# Patient Record
Sex: Male | Born: 1965 | Race: White | Hispanic: No | Smoking: Former smoker
Health system: Southern US, Community
[De-identification: ages and names within clinical notes are randomized; demographics above are authoritative.]

## PROBLEM LIST (undated history)

## (undated) DIAGNOSIS — S82201A Unspecified fracture of shaft of right tibia, initial encounter for closed fracture: Secondary | ICD-10-CM

## (undated) DIAGNOSIS — M1990 Unspecified osteoarthritis, unspecified site: Secondary | ICD-10-CM

## (undated) DIAGNOSIS — I10 Essential (primary) hypertension: Secondary | ICD-10-CM

## (undated) DIAGNOSIS — K529 Noninfective gastroenteritis and colitis, unspecified: Secondary | ICD-10-CM

## (undated) DIAGNOSIS — K219 Gastro-esophageal reflux disease without esophagitis: Secondary | ICD-10-CM

## (undated) HISTORY — PX: WRIST GANGLION EXCISION: SUR520

## (undated) HISTORY — DX: Gastro-esophageal reflux disease without esophagitis: K21.9

## (undated) HISTORY — PX: KNEE ARTHROSCOPY: SUR90

## (undated) HISTORY — DX: Essential (primary) hypertension: I10

## (undated) HISTORY — PX: HAND SURGERY: SHX662

## (undated) HISTORY — DX: Noninfective gastroenteritis and colitis, unspecified: K52.9

---

## 1998-05-26 ENCOUNTER — Ambulatory Visit (HOSPITAL_COMMUNITY): Admission: RE | Admit: 1998-05-26 | Discharge: 1998-05-26 | Payer: Self-pay | Admitting: Gynecology

## 2003-09-05 ENCOUNTER — Ambulatory Visit (HOSPITAL_COMMUNITY): Admission: RE | Admit: 2003-09-05 | Discharge: 2003-09-05 | Payer: Self-pay | Admitting: Orthopedic Surgery

## 2004-12-16 ENCOUNTER — Ambulatory Visit (HOSPITAL_BASED_OUTPATIENT_CLINIC_OR_DEPARTMENT_OTHER): Admission: RE | Admit: 2004-12-16 | Discharge: 2004-12-16 | Payer: Self-pay | Admitting: Specialist

## 2007-03-27 ENCOUNTER — Encounter (INDEPENDENT_AMBULATORY_CARE_PROVIDER_SITE_OTHER): Payer: Self-pay | Admitting: *Deleted

## 2007-03-27 ENCOUNTER — Ambulatory Visit (HOSPITAL_COMMUNITY): Admission: RE | Admit: 2007-03-27 | Discharge: 2007-03-27 | Payer: Self-pay | Admitting: *Deleted

## 2007-12-18 ENCOUNTER — Encounter (INDEPENDENT_AMBULATORY_CARE_PROVIDER_SITE_OTHER): Payer: Self-pay | Admitting: *Deleted

## 2007-12-18 ENCOUNTER — Ambulatory Visit (HOSPITAL_COMMUNITY): Admission: RE | Admit: 2007-12-18 | Discharge: 2007-12-18 | Payer: Self-pay | Admitting: *Deleted

## 2009-10-03 ENCOUNTER — Encounter (INDEPENDENT_AMBULATORY_CARE_PROVIDER_SITE_OTHER): Payer: Self-pay | Admitting: *Deleted

## 2009-11-03 ENCOUNTER — Ambulatory Visit: Payer: Self-pay | Admitting: Gastroenterology

## 2009-11-03 DIAGNOSIS — K5190 Ulcerative colitis, unspecified, without complications: Secondary | ICD-10-CM | POA: Insufficient documentation

## 2009-11-03 DIAGNOSIS — I10 Essential (primary) hypertension: Secondary | ICD-10-CM

## 2009-11-03 LAB — CONVERTED CEMR LAB
ALT: 23 units/L (ref 0–53)
Eosinophils Absolute: 0.1 10*3/uL (ref 0.0–0.7)
Eosinophils Relative: 2.4 % (ref 0.0–5.0)
HCT: 38.8 % — ABNORMAL LOW (ref 39.0–52.0)
Lymphs Abs: 1.4 10*3/uL (ref 0.7–4.0)
MCHC: 33.6 g/dL (ref 30.0–36.0)
MCV: 93.7 fL (ref 78.0–100.0)
Monocytes Absolute: 0.7 10*3/uL (ref 0.1–1.0)
Platelets: 318 10*3/uL (ref 150.0–400.0)
RDW: 12.6 % (ref 11.5–14.6)
Total Bilirubin: 0.3 mg/dL (ref 0.3–1.2)
WBC: 6 10*3/uL (ref 4.5–10.5)

## 2009-11-04 ENCOUNTER — Encounter: Payer: Self-pay | Admitting: Gastroenterology

## 2009-11-26 ENCOUNTER — Telehealth: Payer: Self-pay | Admitting: Gastroenterology

## 2009-11-27 ENCOUNTER — Ambulatory Visit: Payer: Self-pay | Admitting: Gastroenterology

## 2009-11-27 ENCOUNTER — Encounter (INDEPENDENT_AMBULATORY_CARE_PROVIDER_SITE_OTHER): Payer: Self-pay | Admitting: *Deleted

## 2009-11-28 ENCOUNTER — Encounter: Payer: Self-pay | Admitting: Gastroenterology

## 2009-12-08 ENCOUNTER — Telehealth: Payer: Self-pay | Admitting: Gastroenterology

## 2009-12-08 ENCOUNTER — Encounter: Payer: Self-pay | Admitting: Gastroenterology

## 2009-12-08 ENCOUNTER — Ambulatory Visit: Payer: Self-pay | Admitting: Gastroenterology

## 2009-12-08 ENCOUNTER — Ambulatory Visit (HOSPITAL_COMMUNITY): Admission: RE | Admit: 2009-12-08 | Discharge: 2009-12-08 | Payer: Self-pay | Admitting: Gastroenterology

## 2009-12-09 ENCOUNTER — Telehealth: Payer: Self-pay | Admitting: Gastroenterology

## 2009-12-09 ENCOUNTER — Encounter: Payer: Self-pay | Admitting: Family Medicine

## 2009-12-10 ENCOUNTER — Encounter: Payer: Self-pay | Admitting: Gastroenterology

## 2009-12-24 ENCOUNTER — Telehealth: Payer: Self-pay | Admitting: Gastroenterology

## 2009-12-24 ENCOUNTER — Encounter (INDEPENDENT_AMBULATORY_CARE_PROVIDER_SITE_OTHER): Payer: Self-pay | Admitting: *Deleted

## 2009-12-30 ENCOUNTER — Ambulatory Visit: Payer: Self-pay | Admitting: Gastroenterology

## 2010-01-20 ENCOUNTER — Telehealth: Payer: Self-pay | Admitting: Gastroenterology

## 2010-01-21 ENCOUNTER — Telehealth: Payer: Self-pay | Admitting: Gastroenterology

## 2010-01-28 ENCOUNTER — Telehealth: Payer: Self-pay | Admitting: Gastroenterology

## 2010-02-03 ENCOUNTER — Encounter (INDEPENDENT_AMBULATORY_CARE_PROVIDER_SITE_OTHER): Payer: Self-pay | Admitting: *Deleted

## 2010-07-13 ENCOUNTER — Telehealth: Payer: Self-pay | Admitting: Gastroenterology

## 2010-07-21 ENCOUNTER — Ambulatory Visit: Payer: Self-pay | Admitting: Gastroenterology

## 2010-09-17 NOTE — Progress Notes (Signed)
Summary: med ?'s  Phone Note Call from Patient Call back at [phone]   Caller: Patient Call For: Dr. Deatra Ina Reason for Call: Talk to Nurse Summary of Call: questions about meds Initial call taken by: Lucien Mons,  Dec 24, 2009 10:19 AM  Follow-up for Phone Call        He is taking Apriso 0.353m #4 daily, it is very expensive. He was supposed to start Asacol HD, but too expensive,also.  He wants to know if he can get some samples of Apriso?  Samples given to pt.along with a patient saving card for both meds. Pt. to keep scheduled office visit. Pt. instructed to call back as needed.   Follow-up by: DVivia EwingLPN,  May 11, 28938110:17AM

## 2010-09-17 NOTE — Letter (Signed)
Summary: Appt Reminder [facility]  [address] [place], [place] [address]   Phone: [phone]  Fax: [phone]        Dec 24, 2009 MRN: [id number]    [facility] [place], [place]  [address]    Dear [name],   You have a return appointment with Dr.Robert Deatra Ina on 12-30-09 at 2:45pm. Please remember to bring a complete list of the medicines you are taking, your insurance card and your co-pay.  If you have to cancel or reschedule this appointment, please call before 5:00 pm the evening before to avoid a cancellation fee.  If you have any questions or concerns, please call [phone].    Sincerely,    Vivia Ewing LPN

## 2010-09-17 NOTE — Assessment & Plan Note (Signed)
Summary: F/U APPT...LSW.   History of Present Illness Visit Type: Follow-up Visit Primary GI MD: Erskine Emery MD [facility] Chief Complaint: Pt c/o RUQ cramping for about one week.  He has occ belching.  He is taking Apriso and is doing well. History of Present Illness:   [name] has returned for followup of his left-sided colitis.  He was treated for Pseudomonas colitis with Flagyl.  When diarrhea recurred following treatment he underwent sigmoidoscopy which demonstrated a mild colitis.  On apriso symptoms have subsided although he is unable to afford this medicine.  Currently he has occasional loose stools and some crampy upper abdominal pain.  When his colitic symptoms are active he has both bleeding and diarrhea.   GI Review of Systems    Reports abdominal pain and  belching.     Location of  Abdominal pain: RUQ.    Denies acid reflux, bloating, chest pain, dysphagia with liquids, dysphagia with solids, heartburn, loss of appetite, nausea, vomiting, vomiting blood, weight loss, and  weight gain.        Denies anal fissure, black tarry stools, change in bowel habit, constipation, diarrhea, diverticulosis, fecal incontinence, heme positive stool, hemorrhoids, irritable bowel syndrome, jaundice, light color stool, liver problems, rectal bleeding, and  rectal pain.    Current Medications (verified): 1)  Allopurinol 300 Mg Tabs (Allopurinol) .Marland Kitchen.. 1 Tablet By Mouth Once Daily 2)  Omeprazole 20 Mg Cpdr (Omeprazole) .Marland Kitchen.. 1 Capsule By Mouth Once Daily 3)  Amlodipine Besylate 5 Mg Tabs (Amlodipine Besylate) .Marland Kitchen.. 1 Tablet By Mouth Once Daily 4)  Losartan Potassium 100 Mg Tabs (Losartan Potassium) .Marland Kitchen.. 1 Tablet By Mouth Once Daily 5)  Apriso 0.375 Gm Xr24h-Cap (Mesalamine) .... Take 4 Tablets By Mouth Once Daily  Allergies (verified): No Known Drug Allergies  Past History:  Past Medical History: Hypertension Coliits Gout  Past Surgical History: Reviewed history from 11/03/2009 and no  changes required. Unremarkable  Family History: Reviewed history from 11/03/2009 and no changes required. Patient adopted   Social History: Occupation:  Librarian, academic Married 2 boys 1 girl Patient is a former smoker.  Alcohol Use - yes  1 1/2 case/week Illicit Drug Use - no Daily Caffeine Use 1 cup coffee  Review of Systems  The patient denies allergy/sinus, anemia, anxiety-new, arthritis/joint pain, back pain, blood in urine, breast changes/lumps, confusion, cough, coughing up blood, depression-new, fainting, fatigue, fever, headaches-new, hearing problems, heart murmur, heart rhythm changes, itching, muscle pains/cramps, night sweats, nosebleeds, shortness of breath, skin rash, sleeping problems, sore throat, swelling of feet/legs, swollen lymph glands, thirst - excessive, urination - excessive, urination changes/pain, urine leakage, vision changes, and voice change.    Vital Signs:  Patient profile:   45 year old male Height:      70.5 inches Weight:      239 pounds BMI:     33.93 Pulse rate:   80 / minute Pulse rhythm:   regular BP sitting:   114 / 80  (left arm) Cuff size:   regular  Vitals Entered By: Abelino Derrick CMA Deborra Medina) (Dec 30, 2009 2:51 PM)   Impression & Recommendations:  Problem # 1:  UNSPECIFIED ULCERATIVE COLITIS (ICD-556.9) Assessment Improved At the patient's request I will start him on Azulfidine 1 g twice a day.  I carefully instructed him to call me if his symptoms are not well controlled at which point I would try him on a mesalamine alternative.  Of note he has had pseudomembranous colitis which has resolved as determined by  stool studies.  Patient Instructions: 1)  Copy sent to : Walter Pharr,MD 2)  Please schedule a follow up appointment in 3 months 3)  The medication list was reviewed and reconciled.  All changed / newly prescribed medications were explained.  A complete medication list was provided to the patient /  caregiver. Prescriptions: AZULFIDINE 500 MG TABS (SULFASALAZINE) take 2 tabs twice a day  #60 x 5   Entered and Authorized by:   Inda Castle MD   Signed by:   Inda Castle MD on 12/30/2009   Method used:   Electronically to        [facility] (retail)       [address]       [place], [place]  [address]       Ph: [phone]       Fax: [phone]   RxID:   [id number]

## 2010-09-17 NOTE — Letter (Signed)
Summary: Office Visit Letter  [facility]  [address] [place], [place] [address]   Phone: [phone]  Fax: [phone]      February 03, 2010 MRN: [id number]   [facility] [place], [place]  [address]   Dear Mr. [facility],   According to our records, it is time for you to schedule a follow-up office visit with [place] in the month of August 2011.   At your convenience, please call [phone] (option #2)to schedule an office visit. If you have any questions, concerns, or feel that this letter is in error, we would appreciate your call.   Sincerely,   Sandy Salaam. Deatra Ina, M.D.  [facility] Gastroenterology Division [phone]

## 2010-09-17 NOTE — Progress Notes (Signed)
Summary: Medication  Phone Note From Pharmacy   Caller: [place] Call For: Dr. Deatra Ina  Summary of Call: Asacol 400 is no longer avail......Marland Kitchenneeds a alternative Initial call taken by: Webb Laws,  December 08, 2009 11:11 AM  Follow-up for Phone Call        Dr Leodis Sias Advise Follow-up by: Genella Mech CMA Deborra Medina),  December 08, 2009 1:26 PM  Additional Follow-up for Phone Call Additional follow up Details #1::        Asacol 843m tabs take 1 three times a day #90 with 2 refills Additional Follow-up by: RInda CastleMD,  December 08, 2009 2:40 PM    Additional Follow-up for Phone Call Additional follow up Details #2::    Called pt , pt stated that phone note was taken down wrong, Pt stated that she needs a 90 day supply of the Asacol 400 written and mailed to her for a 90 day supply and mailed to her for her mail order. Follow-up by: RGenella MechCMA (Deborra Medina,  December 08, 2009 3:19 PM  Prescriptions: ASACOL 400 MG TBEC (MESALAMINE) take 3 tabs tid  #810 x 3   Entered by:   RGenella MechCMA (AS[place]   Authorized by:   RInda CastleMD   Signed by:   RGenella MechCMA ([place] on 12/08/2009   Method used:   Print then Give to Patient   RxID:   [id number]

## 2010-09-17 NOTE — Op Note (Signed)
Summary: colonoscopy   Dr Lajoyce Corners  NAME:  [name], [name]              ACCOUNT NO.:  [id number]      MEDICAL RECORD NO.:  [id number]          PATIENT TYPE:  AMB      LOCATION:  ENDO                         [facility]ACILITY:  [facility]      PHYSICIAN:  Waverly Ferrari, M.D.    DATE OF BIRTH:  05-22-1966      DATE OF PROCEDURE:  03/27/2007   DATE OF DISCHARGE:                                  OPERATIVE REPORT      PROCEDURE:  Colonoscopy with biopsy.      INDICATIONS:  Rectal bleeding.      ANESTHESIA:   1. Fentanyl 90 mcg.   2. Versed 6 mg.   3. Phenergan 12.5 mg.      PROCEDURE:  With the patient mildly sedated in the left lateral   decubitus position, the Pentax videoscopic colonoscope was inserted into   the rectum and passed under direct vision.  With pressure applied and   the patient rolled to his back, right side, and subsequently back to his   back and left side, we were able to advance the colonoscope to the cecum   as identified by ileocecal valve and appendiceal orifice, both of which   were photographed.  From this point, the colonoscope was slowly   withdrawn, taking circumferential views of colonic mucosa, stopping at   about 35 cm from the anal verge, at which point the normal-appearing   mucosa turned red and showed changes of colitis, which were photographed   and multiple biopsies taken.  At approximately 20-25 cm from the anal   verge, the mucosa turned back to normal.  This too was photographed and   biopsied and placed in a separate container.  The endoscope was placed   in retroflexion to view the anal canal from above.  The endoscope was   straightened and withdrawn.  The patient's vital signs and pulse   oximeter remained stable.  The patient tolerated the procedure well,   without apparent complication.      FINDINGS:  Localized area of colitis from approximately 20-25 cm from   the anal verge to approximately 35 cm from the anal verge.  Internal   hemorrhoids, rare  diverticulum, otherwise an unremarkable exam.      PLAN:  Await biopsy report.  The patient will call me for results and   follow up with me as an outpatient.                  ______________________________   Waverly Ferrari, M.D.            GMO/MEDQ  D:  03/27/2007  T:  03/28/2007  Job:  [id number]

## 2010-09-17 NOTE — Letter (Signed)
Summary: Results Letter  [facility]  [place], [place] [address]   Phone: [phone]  Fax: [phone]        December 10, 2009 MRN: [id number]    [facility] [place], [place]  [address]    Dear [name],   Your biopsies demonstrated inflammatory changes only.    Please follow the recommendations previously discussed.  Should you have any immediate concerns or questions, feel free to contact me at the office.    Sincerely,  Sandy Salaam. Deatra Ina, M.D., [facility]          Sincerely,  Inda Castle MD  This letter has been electronically signed by your physician.  Appended Document: Results Letter Letter mailed to patient.

## 2010-09-17 NOTE — Progress Notes (Signed)
Summary: Medication  Phone Note Call from Patient Call back at Home Phone [phone]   Caller: Patient Call For: Dr. Deatra Ina Reason for Call: Talk to Nurse Summary of Call: pt is needing a 50msupply of Azulfadine #360 tab. only #180 tablets were called in. Initial call taken by: CWebb Laws  January 21, 2010 2:31 PM  Follow-up for Phone Call        called [facility] and corrected with a rep... i have notified the patient.  Follow-up by: LBernita BuffyCMA (Deborra Medina,  January 21, 2010 5:12 PM    Prescriptions: AZULFIDINE 500 MG TABS (SULFASALAZINE) take 2 tabs twice a day  #360 x 3   Entered by:   LBernita BuffyCMA ([place]   Authorized by:   RInda CastleMD   Signed by:   LBernita BuffyCMA (AWest [place] on 01/21/2010   Method used:   Telephoned to ...       [facility] [address] (retail)       [address]      [place]  [address]      Ph: [phone]      Fax: [phone]  RxID:   [id number]

## 2010-09-17 NOTE — Progress Notes (Signed)
Summary: Change in Asacol  ---- Converted from flag ---- ---- 12/09/2009 9:19 AM, Inda Castle MD wrote: ok  ---- 12/08/2009 4:42 PM, Bernita Buffy CMA (AAMA) wrote: The pharmacy is calling says that they do not make the "regular" Asacol anymore all they make now is Asacol EC 468m and she would like to know if the dose can stay the same on that rx? [facility] an extended release tablet also coated.  RSuanne [phone]------------------------------  Phone Note Outgoing Call   Call placed by: LBernita BuffyCMA (Deborra Medina,  December 09, 2009 1:57 PM Call placed to: Patient Summary of Call: called the pharm ancd advised per Dr. KDeatra Inaok to give Asacol EC at the same dose. Initial call taken by: LBernita BuffyCMA (AAMA),  December 09, 2009 1:57 PM    New/Updated Medications: * ASACOL EC 400 MG TBEC (MESALAMINE) take 3 tabs tid

## 2010-09-17 NOTE — Assessment & Plan Note (Signed)
Summary: rectal bleeding,reflux/sheri   History of Present Illness Visit Type: Follow-up Visit Primary GI MD: Erskine Emery MD [facility] Primary Provider: Junius Roads, MD Chief Complaint: BRB per rectum, no hemorrhoids that pt is aware of.  Pt is also having problems with reflux-belching on Omeprazole History of Present Illness:   [name] has returned for evaluation of rectal bleeding.  He recently reduced his Azulfidine to 1 g daily.  After this reduction he developed loose stools with bleeding.  Over the past week he has increased back to his maintenance dose of 1 g twice a day.  With the increase stools have become more firm and bleeding has decreased.  He is without abdominal pain.   GI Review of Systems    Reports acid reflux.      Denies abdominal pain, belching, bloating, chest pain, dysphagia with liquids, dysphagia with solids, heartburn, loss of appetite, nausea, vomiting, vomiting blood, weight loss, and  weight gain.      Reports rectal bleeding.     Denies anal fissure, black tarry stools, change in bowel habit, constipation, diarrhea, diverticulosis, fecal incontinence, heme positive stool, hemorrhoids, irritable bowel syndrome, jaundice, light color stool, liver problems, and  rectal pain.    Current Medications (verified): 1)  Allopurinol 300 Mg Tabs (Allopurinol) .Marland Kitchen.. 1 Tablet By Mouth Once Daily 2)  Omeprazole 20 Mg Cpdr (Omeprazole) .Marland Kitchen.. 1 Capsule By Mouth Once Daily 3)  Amlodipine Besylate 5 Mg Tabs (Amlodipine Besylate) .Marland Kitchen.. 1 Tablet By Mouth Once Daily 4)  Losartan Potassium 100 Mg Tabs (Losartan Potassium) .Marland Kitchen.. 1 Tablet By Mouth Once Daily 5)  Azulfidine 500 Mg Tabs (Sulfasalazine) .... Take 2 Tabs Twice A Day  Allergies (verified): No Known Drug Allergies  Past History:  Past Medical History: Hypertension Coliits Gout GERD  Past Surgical History: Reviewed history from 11/03/2009 and no changes required. Unremarkable  Family History: Reviewed history  from 11/03/2009 and no changes required. Patient adopted   Social History: Reviewed history from 12/30/2009 and no changes required. 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  Alcohol Use - yes  1 1/2 case/week Illicit Drug Use - no Daily Caffeine Use 1 cup coffee  Vital Signs:  Patient profile:   45 year old male Height:      70.5 inches Weight:      235 pounds BMI:     33.36 Pulse rate:   76 / minute Pulse rhythm:   regular BP sitting:   124 / 82  (left arm) Cuff size:   regular  Vitals Entered By: Abelino Derrick CMA Deborra Medina) (July 21, 2010 2:48 PM)   Impression & Recommendations:  Problem # 1:  UNSPECIFIED ULCERATIVE COLITIS (ICD-556.9) Mild flareup is likely due to a reduction in his maintenance dose.  He appears to be improving.  Recommendations #1 increase Azulfidine 1500 mg twice a day for the next 2-3 weeks.  Provided he is back to baseline  he will reduce to his maintenance dose of 1 g twice a day.   I carefully instructed the patient to call me in 2 weeks if he has not continued to improve at which point I would add a second medication  Patient Instructions: 1)  Copy sent to : Junius Roads, MD 2)  Please schedule a follow-up appointment as needed.  3)  The medication list was reviewed and reconciled.  All changed / newly prescribed medications were explained.  A complete medication list was provided to the patient /  caregiver.   Immunizations Administered:  Influenza Vaccine # 1:    Vaccine Type: Fluvirin    Site: right deltoid    Mfr: novartis    Dose: 0.5 ml    Route: IM    Given by: Abelino Derrick CMA ([place])    Exp. Date: 01/2011    Lot #: [id number]    VIS given: 03/10/10 version given July 21, 2010.  Flu Vaccine Consent Questions:    Do you have a history of severe allergic reactions to this vaccine? no    Any prior history of allergic reactions to egg and/or gelatin? no    Do you have a sensitivity to the  preservative Thimersol? no    Do you have a past history of Guillan-Barre Syndrome? no    Do you currently have an acute febrile illness? no    Have you ever had a severe reaction to latex? no    Vaccine information given and explained to patient? yes

## 2010-09-17 NOTE — Miscellaneous (Signed)
Summary: [facility] Previsit/prep  Clinical Lists Changes  Observations: Added new observation of NKA: T (11/27/2009 16:12)

## 2010-09-17 NOTE — Procedures (Signed)
Summary: Flexible Sigmoidoscopy  Patient: [name] Note: All result statuses are Final unless otherwise noted.  Tests: (1) Flexible Sigmoidoscopy (FLX)  FLX Flexible Sigmoidoscopy                             DONE     [facility]     [place], [place]  [address]           FLEXIBLE SIGMOIDOSCOPY PROCEDURE REPORT           PATIENT:  [name], [name]  MR#:  [id number]     BIRTHDATE:  1966/02/05, 82 yrs. old  GENDER:  male           ENDOSCOPIST:  Sandy Salaam. Deatra Ina, MD     Referred by:           PROCEDURE DATE:  12/08/2009     PROCEDURE:  Flexible Sigmoidoscopy with biopsy     ASA CLASS:  Class II     INDICATIONS:  diarrhea, assess known ulcerative colitis           MEDICATIONS:   Fentanyl 100 mcg IV, Versed 9 mg IV           DESCRIPTION OF PROCEDURE:   After the risks benefits and     alternatives of the procedure were thoroughly explained, informed     consent was obtained.  Digital rectal exam was performed and     revealed no abnormalities.   The  endoscope was introduced through     the anus and advanced to the descending colon, without     limitations.  The quality of the prep was .  The instrument was     then slowly withdrawn as the mucosa was fully examined.     <<PROCEDUREIMAGES>>           Colitis was found rectum to descending colon Multiple areas of     blotchy erythema, mild, throughout left colon. Bxs taken (see     image1, image2, image3, and image5).  Retroflexed views in the     rectum revealed no abnormalities.    The scope was then withdrawn     from the patient and the procedure terminated.           COMPLICATIONS:  None           ENDOSCOPIC IMPRESSION:     1) Colitis in the rectum to descending colon, minimal     RECOMMENDATIONS:     1) continue current meds           REPEAT EXAM:  No           ______________________________     Sandy Salaam. Deatra Ina, MD           CC:  Alden Server, MD           n.     Lorrin Mais:    Sandy Salaam. Kaplan at 12/08/2009 10:05 AM           Huey Bienenstock, [id number]  Note: An exclamation mark (!) indicates a result that was not dispersed into the flowsheet. Document Creation Date: 12/08/2009 10:06 AM _______________________________________________________________________  (1) Order result status: Final Collection or observation date-time: 12/08/2009 10:01 Requested date-time:  Receipt date-time:  Reported date-time:  Referring Physician:   Ordering Physician: Erskine Emery [id number]) Specimen Source:  Source: Tawanna Cooler Order Number: [id number] Lab site:

## 2010-09-17 NOTE — Miscellaneous (Signed)
Summary: Vaccine Orvan Falconer MD  Vaccine Orvan Falconer MD   Imported By: Edmonia James 12/09/2009 11:43:29  _____________________________________________________________________  External Attachment:    Type:   Image     Comment:   External Document

## 2010-09-17 NOTE — Progress Notes (Signed)
Summary: triage  Phone Note Other Incoming Call back at Home Phone [phone]   Caller: Pt's wife, Suanne Marker Details for Reason: triage Summary of Call: Pt's wife Suanne Marker called stating patient was having "bleeding" again and needed to be seen.  Please call and advise. Initial call taken by: Darliss Ridgel,  July 13, 2010 9:39 AM  Follow-up for Phone Call        Wife reports patient is out of town.  She is unsure of his symptoms.  He is having some rectal bleeding and  reflux.  REV is scheduled with Dr Deatra Ina for 07/21/10 2:30.  Patient with a hx of UC. Barb Merino, RN  Follow-up by: Shella Maxim RN,  July 13, 2010 10:02 AM

## 2010-09-17 NOTE — Letter (Signed)
Summary: Results Letter  [facility]  [place], [place] [address]   Phone: [phone]  Fax: [phone]        November 03, 2009 MRN: [id number]    [facility] [place],   [address]    Dear Mr. [facility],  It is my pleasure to have treated you recently as a new patient in my office. I appreciate your confidence and the opportunity to participate in your care.  Since I do have a busy inpatient endoscopy schedule and office schedule, my office hours vary weekly. I am, however, available for emergency calls everyday through my office. If I am not available for an urgent office appointment, another one of our gastroenterologist will be able to assist you.  My well-trained staff are prepared to help you at all times. For emergencies after office hours, a physician from our Gastroenterology section is always available through my 24 hour answering service  Once again I welcome you as a new patient and I look forward to a happy and healthy relationship             Sincerely,  Inda Castle MD  This letter has been electronically signed by your physician.  Appended Document: Results Letter letter mailed

## 2010-09-17 NOTE — Progress Notes (Signed)
Summary: CONDITION UPDATE-Flex scheduled  Phone Note Call from Patient Call back at [phone]   Caller: Patient Call For: Dr. Deatra Ina Reason for Call: Talk to Nurse Summary of Call: would like to discuss last appt and what he needs to do next Initial call taken by: Lucien Mons,  November 26, 2009 11:23 AM  Follow-up for Phone Call        OV 11-03-09, Positive for C-Diff 11-04-09, Completed 10 days of Flagyl, off Prednisone, takes Apriso 3G daily.  Calling with an update: Stools are loosly formed, sees BRB/mucus in toilet and on tissue most mornings. Pt. states he feels fine, no pain.   [facility] PLEASE ADVISE  Follow-up by: Vivia Ewing LPN,  November 27, 8307 2:50 PM  Additional Follow-up for Phone Call Additional follow up Details #1::        needs repeat c. diff test schedule sigmoidoscopy Additional Follow-up by: Inda Castle MD,  November 26, 2009 4:10 PM    Additional Follow-up for Phone Call Additional follow up Details #2::    Above MD orders reviewed with patient. He is scheduled for a previsit on 11-27-09 at 4:30pm and Flex at [facility] on 12-08-09 at 9:45am. He will repeat stool for C-Diff on 11-27-09. Pt. instructed to call back as needed.  Follow-up by: Vivia Ewing LPN,  November 27, 4074 4:56 PM

## 2010-09-17 NOTE — Op Note (Signed)
Summary: colonoscopy  NAME:  [name], AUGELLO              ACCOUNT NO.:  [id number]      MEDICAL RECORD NO.:  [id number]          PATIENT TYPE:  AMB      LOCATION:  ENDO                         FACILITY:  [facility]      PHYSICIAN:  Waverly Ferrari, M.D.    DATE OF BIRTH:  09-25-1965      DATE OF PROCEDURE:   DATE OF DISCHARGE:                                  OPERATIVE REPORT      PROCEDURE:  Colonoscopy.      INDICATIONS:  Rectal bleeding.      ANESTHESIA:  Fentanyl 50 mcg, Versed 3 mg.      PROCEDURE:  With the patient mildly sedated in the left lateral   decubitus position, a rectal exam was attempted.  Subsequently, the   Pentax videoscopic colonoscope was inserted into the rectum and we   immediately saw diffuse colitis and proctitis.  We advanced the   endoscope under direct vision to the cecum, identified by ileocecal   valve and appendiceal orifice, and around the appendiceal orifice, once   again, the colonic mucosa looked somewhat inflamed.  This was   photographed and biopsied.  We attempted to get into the terminal ileum   but could not.  Subsequently, the Pentax videoscopic colonoscope was   slowly withdrawn, taking circumferential views of colonic mucosa,   stopping at approximately 60 cm from anal verge, at which point the   colitis was noted and from this point the colonoscope was slowly   withdrawn taking circumferential views of colonic mucosa, stopping to   photograph and biopsy areas of this diffuse colitis until we reached the   rectum, and biopsies of the rectum and placed in a separate container.   I did not attempt to place the scope in retroflexed view because of   diffuse inflammatory changes.  The endoscope was withdrawn.  The   patient's vital signs and pulse oximeter remained stable.  The patient   tolerated procedure well without apparent complications.      FINDINGS:  Diffuse colitis involving the distal 60 cm of the colon, more   intense distally than  proximally, but continuous from that point and   question of inflammatory changes involving the cecum at the appendiceal   orifice.      PLAN:  Await biopsy reports.  The patient will call me for results and   follow-up with me as an outpatient as needed.                  ______________________________   Waverly Ferrari, M.D.            GMO/MEDQ  D:  12/18/2007  T:  12/18/2007  Job:  [id number]

## 2010-09-17 NOTE — Letter (Signed)
Summary: [facility]  [name], [name] [address]   Phone: [phone]  Fax: [phone]       [name]    1966/06/11    MRN: [id number]        Procedure Day [name]:  [name]  12/08/09     Arrival Time:  8:45AM     Procedure Time:  9:45AM     Location of Procedure:                     [name]  [name] ( Outpatient Registration)    [name]  Prior to the day before your procedure, purchase one 8 oz. bottle of Magnesium Citrate and one Fleet Enema from the laxative section of your drugstore.  _________________________________________________________________________________________________  THE DAY BEFORE YOUR PROCEDURE             DATE: 12/07/09     DAY: SUNDAY  1.   Have a clear liquid dinner the night before your procedure.  2.   Do not drink anything colored red or purple.  Avoid juices with pulp.  No orange juice.              CLEAR LIQUIDS INCLUDE: Water Jello Ice Popsicles Tea (sugar ok, no milk/cream) Powdered fruit flavored drinks Coffee (sugar ok, no milk/cream) Gatorade Juice: apple, white grape, white cranberry  Lemonade Clear bullion, consomm, broth Carbonated beverages (any kind) Strained chicken noodle soup Hard Candy   3.   At 7:00 pm the night before your procedure, drink one bottle of Magnesium Citrate over ice.  4.   Drink at least 3 more glasses of clear liquids before bedtime (preferably juices).  5.   Results are expected usually within 1 to 6 hours after taking the Magnesium Citrate.  ___________________________________________________________________________________________________  THE DAY OF YOUR PROCEDURE            DATE:   12/08/09   DAY: MONDAY  1.   Use Fleet Enema one hour prior to coming for procedure.  2.   You may drink clear liquids until 5:45AM (4 hours before exam)       MEDICATION INSTRUCTIONS  Unless  otherwise instructed, you should take regular prescription medications with a small sip of water as early as possible the morning of your procedure.   Additional medication instructions:  Take your blood pressure medicine the morning of procedure.         OTHER INSTRUCTIONS  You will need a responsible adult at least 46 years of age to accompany you and drive you home.   This person must remain in the waiting room during your procedure.  Wear loose fitting clothing that is easily removed.  Leave jewelry and other valuables at home.  However, you may wish to bring a book to read or an iPod/MP3 player to listen to music as you wait for your procedure to start.  Remove all body piercing jewelry and leave at home.  Total time from sign-in until discharge is approximately 2-3 hours.  You should go home directly after your procedure and rest.  You can resume normal activities the day after your procedure.  The day of your procedure you should not:   Drive   Make legal decisions   Operate machinery   Drink alcohol   Return to work  You will receive specific instructions about eating, activities and medications before you leave.  The above instructions have been reviewed and explained to me by   Emerson Monte RN  November 27, 2009 4:25 PM     I fully understand and can verbalize these instructions _____________________________ Date _________

## 2010-09-17 NOTE — Procedures (Signed)
Summary: Instruction for procedure/[facility] (out pt)  Instruction for procedure/[facility] (out pt)   Imported By: Phillis Knack 12/02/2009 07:41:15  _____________________________________________________________________  External Attachment:    Type:   Image     Comment:   External Document

## 2010-09-17 NOTE — Progress Notes (Signed)
Summary: Needs 34msupply sent to [facility]  Phone Note Call from Patient Call back at Home Phone ([phone]  Call For: Dr KDeatra InaReason for Call: Refill Medication Summary of Call: Needs to have a prescription Azulfadine is working really well and [facility] [place] [facility] Initial call taken by: [facility] January 20, 2010 10:12 AM  Follow-up for Phone Call        rx sent, left message on machine to let pt know rx sent. Follow-up by: LeBernita BuffyMA ([facility]  January 20, 2010 1:29 PM    Prescriptions: AZULFIDINE 500 MG TABS (SULFASALAZINE) take 2 tabs twice a day  #180 x 3   Entered by:   LeBernita BuffyMA ([facility]  Authorized by:   RoInda CastleD   Signed by:   LeBernita BuffyMA (A[facility] 01/20/2010   Method used:   [facility]             ,          Ph: [phone]     Fax: [phone] RxID: :   [id number]

## 2010-09-17 NOTE — Op Note (Signed)
Summary: EGD  Dr Lajoyce Corners  NAME:  [facility], [place]              ACCOUNT NO.:  [id number]      MEDICAL RECORD NO.:  [id number]          PATIENT TYPE:  AMB      LOCATION:  ENDO                         FACILITY:  [facility]      PHYSICIAN:  Waverly Ferrari, M.D.    DATE OF BIRTH:  August 19, 1965      DATE OF PROCEDURE:   DATE OF DISCHARGE:                                  OPERATIVE REPORT      PROCEDURE:  Upper endoscopy.      INDICATIONS:  GERD.      ANESTHESIA:  Fentanyl 75 mcg, Versed 5 mg.      DESCRIPTION OF PROCEDURE:  With the patient mildly sedated in the left   lateral decubitus position, the Pentax videoscopic endoscope was   inserted in the mouth and passed under direct vision through the   esophagus which appeared normal, into the stomach, fundus, body, antrum,   duodenal bulb, second portion duodenum, all appeared normal.  From this   point the endoscope was slowly withdrawn taking circumferential views of   duodenal mucosa until the endoscope had been pulled back into stomach.   Placed in retroflexion to view the stomach from below.  The endoscope   was straightened and withdrawn taking circumferential views of the   remaining gastric and esophageal mucosa.  The patient's vital signs and   pulse oximeter remained stable.  The patient tolerated procedure well   without apparent complications.      FINDINGS:  Negative examination.      PLAN:  Proceed to colonoscopy.                  ______________________________   Waverly Ferrari, M.D.            GMO/MEDQ  D:  12/18/2007  T:  12/18/2007  Job:  [id number]

## 2010-09-17 NOTE — Assessment & Plan Note (Signed)
Summary: colitis--ch.   History of Present Illness Visit Type: Initial Visit Primary GI MD: Erskine Emery MD [facility] Chief Complaint: colitis flare History of Present Illness:   [name] is a 45 year old white male referred at the request of Dr. Shelia Media for evaluation of colitis.  He was diagnosed with ulcerative colitis in 2008.  He has primarily left-sided colitis though biopsies of the more proximal colon also demonstrated inflammatory changes.  He was doing well on apriso until he ran out of the medication.  He was off his medicines for 3 months.  After finding more drug her resumed medication 2 months ago when his symptoms return.  Despite restarting medications he has had moderately severe colitic symptoms consisting of frequent loose bloody stools.  He has about 8 stools daily and awakens him at night.  They are accompanied by urgency.  Stools are loose and mixed with blood.     GI Review of Systems    Reports acid reflux and  belching.      Denies abdominal pain, bloating, chest pain, dysphagia with liquids, dysphagia with solids, heartburn, loss of appetite, nausea, vomiting, vomiting blood, weight loss, and  weight gain.      Reports diarrhea and  rectal bleeding.     Denies anal fissure, black tarry stools, change in bowel habit, constipation, diverticulosis, fecal incontinence, heme positive stool, hemorrhoids, irritable bowel syndrome, jaundice, light color stool, liver problems, and  rectal pain. Preventive Screening-Counseling & Management  Alcohol-Tobacco     Smoking Status: quit      Drug Use:  no.      Current Medications (verified): 1)  Apriso 0.375 Gm Xr24h-Cap (Mesalamine) .... 4 Capsules Once Daily 2)  Allopurinol 300 Mg Tabs (Allopurinol) .Marland Kitchen.. 1 Tablet By Mouth Once Daily 3)  Omeprazole 20 Mg Cpdr (Omeprazole) .Marland Kitchen.. 1 Capsule By Mouth Once Daily 4)  Amlodipine Besylate 5 Mg Tabs (Amlodipine Besylate) .Marland Kitchen.. 1 Tablet By Mouth Once Daily 5)  Losartan Potassium 100 Mg  Tabs (Losartan Potassium) .Marland Kitchen.. 1 Tablet By Mouth Once Daily 6)  Aleve 220 Mg Tabs (Naproxen Sodium) .Marland Kitchen.. 1 Tablet By Mouth Once Daily  Allergies (verified): No Known Drug Allergies  Past History:  Past Medical History: Hypertension Coliits  Past Surgical History: Unremarkable  Family History: Patient adopted   Social History: Reviewed history and no changes required. 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  Alcohol Use - yes  6 per day Illicit Drug Use - no Smoking Status:  quit Drug Use:  no  Review of Systems       The patient complains of change in vision.  The patient denies allergy/sinus, anemia, anxiety-new, arthritis/joint pain, back pain, blood in urine, breast changes/lumps, confusion, cough, coughing up blood, depression-new, fainting, fatigue, fever, headaches-new, hearing problems, heart murmur, heart rhythm changes, itching, muscle pains/cramps, night sweats, nosebleeds, shortness of breath, skin rash, sleeping problems, sore throat, swelling of feet/legs, swollen lymph glands, thirst - excessive, urination - excessive, urination changes/pain, urine leakage, vision changes, and voice change.         All other systems were reviewed and were negative   Vital Signs:  Patient profile:   45 year old male Height:      70.5 inches Weight:      241 pounds BMI:     34.21 Pulse rate:   88 / minute Pulse rhythm:   regular BP sitting:   132 / 98  (left arm)  Vitals Entered By:  Randye Lobo NCMA (November 03, 2009 3:42 PM)  Physical Exam  Additional Exam:  On physical exam he is a well-developed well-nourished male  skin: anicteric HEENT: normocephalic; PEERLA; no nasal or pharyngeal abnormalities neck: supple nodes: no cervical lymphadenopathy chest: clear to ausculatation and percussion heart: no murmurs, gallops, or rubs abd: soft, nontender; BS normoactive; no abdominal masses, tenderness, organomegaly rectal:  deferred ext: no cynanosis, clubbing, edema skeletal: no deformities neuro: oriented x 3; no focal abnormalities    Impression & Recommendations:  Problem # 1:  UNSPECIFIED ULCERATIVE COLITIS (ICD-556.9) Assessment Deteriorated Tthe patient has a pancolitis and is in a flare. Pseudomembranous colitis should be ruled out.  With chronic bleeding anemia is also concern.  Recommendations #1 continue apriso 3 g daily #2 begin prednisone 40 mg a day.  The patient was instructed to call back to report his progress in 4-5 days #3 check CBC Orders: TLB-CBC Platelet - w/Differential (85025-CBCD) TLB-Hepatic/Liver Function Pnl (80076-HEPATIC)  Problem # 2:  ESSENTIAL HYPERTENSION, BENIGN (ICD-401.1) Assessment: Comment Only  Other Orders: T-Culture, C-Diff Toxin A/B ([id number])  Patient Instructions: 1)  We are sending your rx to your mail order pharmacy 2)  You will go to the basement for labs today 3)  Please schedule a follow up appointment in 3 weeks 4)  The medication list was reviewed and reconciled.  All changed / newly prescribed medications were explained.  A complete medication list was provided to the patient / caregiver. 5)  Please schedule a follow-up appointment in 3 weeks.  6)  CC Dr. Deland Pretty Prescriptions: APRISO 0.375 GM XR24H-CAP (MESALAMINE) 4 capsules once daily  #360 x 4   Entered and Authorized by:   Inda Castle MD   Signed by:   Inda Castle MD on 11/03/2009   Method used:   Printed then faxed to ...       [facility], [place] (mail-order)       [place]  [address]       Ph: [phone]       Fax: [phone]   RxID:   [id number] PREDNISONE 10 MG TABS (PREDNISONE) take 4 tablets daily then use as directed  #120 x 3   Entered and Authorized by:   Inda Castle MD   Signed by:   Inda Castle MD on 11/03/2009   Method used:   Printed then faxed to ...       [facility]  [place] (mail-order[place]  [address]       Ph: [phone]       Fax: [phone]   RxID:   [id number]

## 2010-09-17 NOTE — Miscellaneous (Signed)
  Clinical Lists Changes  Medications: Removed medication of APRISO 0.375 GM XR24H-CAP (MESALAMINE) 4 capsules once daily Added new medication of ASACOL 400 MG TBEC (MESALAMINE) take 3 tabs tid - Signed Rx of ASACOL 400 MG TBEC (MESALAMINE) take 3 tabs tid;  #300 x 2;  Signed;  Entered by: Inda Castle MD;  Authorized by: Inda Castle MD;  Method used: Electronically to [facility] [address] [place], [place], [place]  [address], Ph: [phone], Fax: [phone]    Prescriptions: ASACOL 400 MG TBEC (MESALAMINE) take 3 tabs tid  #300 x 2   Entered and Authorized by:   Inda Castle MD   Signed by:   Inda Castle MD on 12/08/2009   Method used:   Electronically to        [facility] (retail)       [address]       [place], [place]  [address]       Ph: [phone]       Fax: [phone]   RxID:   [id number]

## 2010-09-17 NOTE — Letter (Signed)
Summary: New Patient letter  [facility]  [address], [place] [address]   Phone: [phone]  Fax: [phone]       10/03/2009 MRN: [id number]  [place], [place]  [address]  Dear Mr. [facility],  Welcome to the Gastroenterology Division at [facility].    You are scheduled to see Dr.  Deatra Ina on 11-03-08 at 3:45p.m. on the 3rd floor at [facility], [place] [employer].  We ask that you try to arrive at our office 15 minutes prior to your appointment time to allow for check-in.  We would like you to complete the enclosed self-administered evaluation form prior to your visit and bring it with you on the day of your appointment.  We will review it with you.  Also, please bring a complete list of all your medications or, if you prefer, bring the medication bottles and we will list them.  Please bring your insurance card so that we may make a copy of it.  If your insurance requires a referral to see a specialist, please bring your referral form from your primary care physician.  Co-payments are due at the time of your visit and may be paid by cash, check or credit card.     Your office visit will consist of a consult with your physician (includes a physical exam), any laboratory testing he/she may order, scheduling of any necessary diagnostic testing (e.g. x-ray, ultrasound, CT-scan), and scheduling of a procedure (e.g. Endoscopy, Colonoscopy) if required.  Please allow enough time on your schedule to allow for any/all of these possibilities.    If you cannot keep your appointment, please call [phone] to cancel or reschedule prior to your appointment date.  This allows [place] the opportunity to schedule an appointment for another patient in need of care.  If you do not cancel or reschedule by 5 p.m. the business day prior to your appointment date, you will be charged a $50.00 late cancellation/no-show fee.    Thank you for  choosing [facility] for your medical needs.  We appreciate the opportunity to care for you.  Please visit [place] at our website  to learn more about our practice.                     Sincerely,                                                             The Gastroenterology Division

## 2010-09-17 NOTE — Progress Notes (Signed)
Summary: ? re meds  Phone Note From Pharmacy Call back at [phone]   Caller: [facility] ---Kendrick Fries  Summary of Call: Kendrick Fries from [facility] wants to know if is ok for pt to get a 90 days supply for his meds. reference [id number] Initial call taken by: Ronalee Red,  January 28, 2010 10:02 AM  Follow-up for Phone Call        left messgae on [facility] machine ok to fill 3 month supply as they were advised on 01/21/2010.  Follow-up by: Bernita Buffy CMA Deborra Medina),  January 28, 2010 10:59 AM

## 2010-12-29 NOTE — Op Note (Signed)
[name], [name] NO.:  [id number]   MEDICAL RECORD NO.:  [id number]          PATIENT TYPE:  AMB   LOCATION:  ENDO                         FACILITY:  [facility]   PHYSICIAN:  Waverly Ferrari, M.D.    DATE OF BIRTH:  1966-05-25   DATE OF PROCEDURE:  DATE OF DISCHARGE:                               OPERATIVE REPORT   PROCEDURE:  Upper endoscopy.   INDICATIONS:  GERD.   ANESTHESIA:  Fentanyl 75 mcg, Versed 5 mg.   DESCRIPTION OF PROCEDURE:  With the patient mildly sedated in the left  lateral decubitus position, the Pentax videoscopic endoscope was  inserted in the mouth and passed under direct vision through the  esophagus which appeared normal, into the stomach, fundus, body, antrum,  duodenal bulb, second portion duodenum, all appeared normal.  From this  point the endoscope was slowly withdrawn taking circumferential views of  duodenal mucosa until the endoscope had been pulled back into stomach.  Placed in retroflexion to view the stomach from below.  The endoscope  was straightened and withdrawn taking circumferential views of the  remaining gastric and esophageal mucosa.  The patient's vital signs and  pulse oximeter remained stable.  The patient tolerated procedure well  without apparent complications.   FINDINGS:  Negative examination.   PLAN:  Proceed to colonoscopy.           ______________________________  Waverly Ferrari, M.D.     GMO/MEDQ  D:  12/18/2007  T:  12/18/2007  Job:  [id number]

## 2010-12-29 NOTE — Op Note (Signed)
[name], HOCTOR NO.:  [id number]   MEDICAL RECORD NO.:  [id number]          PATIENT TYPE:  AMB   LOCATION:  ENDO                         FACILITY:  [facility]   PHYSICIAN:  Waverly Ferrari, M.D.    DATE OF BIRTH:  1965/12/02   DATE OF PROCEDURE:  03/27/2007  DATE OF DISCHARGE:                               OPERATIVE REPORT   PROCEDURE:  Colonoscopy with biopsy.   INDICATIONS:  Rectal bleeding.   ANESTHESIA:  1. Fentanyl 90 mcg.  2. Versed 6 mg.  3. Phenergan 12.5 mg.   PROCEDURE:  With the patient mildly sedated in the left lateral  decubitus position, the Pentax videoscopic colonoscope was inserted into  the rectum and passed under direct vision.  With pressure applied and  the patient rolled to his back, right side, and subsequently back to his  back and left side, we were able to advance the colonoscope to the cecum  as identified by ileocecal valve and appendiceal orifice, both of which  were photographed.  From this point, the colonoscope was slowly  withdrawn, taking circumferential views of colonic mucosa, stopping at  about 35 cm from the anal verge, at which point the normal-appearing  mucosa turned red and showed changes of colitis, which were photographed  and multiple biopsies taken.  At approximately 20-25 cm from the anal  verge, the mucosa turned back to normal.  This too was photographed and  biopsied and placed in a separate container.  The endoscope was placed  in retroflexion to view the anal canal from above.  The endoscope was  straightened and withdrawn.  The patient's vital signs and pulse  oximeter remained stable.  The patient tolerated the procedure well,  without apparent complication.   FINDINGS:  Localized area of colitis from approximately 20-25 cm from  the anal verge to approximately 35 cm from the anal verge.  Internal  hemorrhoids, rare diverticulum, otherwise an unremarkable exam.   PLAN:  Await biopsy report.  The  patient will call me for results and  follow up with me as an outpatient.           ______________________________  Waverly Ferrari, M.D.     GMO/MEDQ  D:  03/27/2007  T:  03/28/2007  Job:  [id number]

## 2010-12-29 NOTE — Op Note (Signed)
[name], [name] NO.:  [id number]   MEDICAL RECORD NO.:  [id number]          PATIENT TYPE:  AMB   LOCATION:  ENDO                         FACILITY:  [facility]   PHYSICIAN:  Waverly Ferrari, M.D.    DATE OF BIRTH:  August 25, 1965   DATE OF PROCEDURE:  DATE OF DISCHARGE:                               OPERATIVE REPORT   PROCEDURE:  Colonoscopy.   INDICATIONS:  Rectal bleeding.   ANESTHESIA:  Fentanyl 50 mcg, Versed 3 mg.   PROCEDURE:  With the patient mildly sedated in the left lateral  decubitus position, a rectal exam was attempted.  Subsequently, the  Pentax videoscopic colonoscope was inserted into the rectum and we  immediately saw diffuse colitis and proctitis.  We advanced the  endoscope under direct vision to the cecum, identified by ileocecal  valve and appendiceal orifice, and around the appendiceal orifice, once  again, the colonic mucosa looked somewhat inflamed.  This was  photographed and biopsied.  We attempted to get into the terminal ileum  but could not.  Subsequently, the Pentax videoscopic colonoscope was  slowly withdrawn, taking circumferential views of colonic mucosa,  stopping at approximately 60 cm from anal verge, at which point the  colitis was noted and from this point the colonoscope was slowly  withdrawn taking circumferential views of colonic mucosa, stopping to  photograph and biopsy areas of this diffuse colitis until we reached the  rectum, and biopsies of the rectum and placed in a separate container.  I did not attempt to place the scope in retroflexed view because of  diffuse inflammatory changes.  The endoscope was withdrawn.  The  patient's vital signs and pulse oximeter remained stable.  The patient  tolerated procedure well without apparent complications.   FINDINGS:  Diffuse colitis involving the distal 60 cm of the colon, more  intense distally than proximally, but continuous from that point and  question of inflammatory  changes involving the cecum at the appendiceal  orifice.   PLAN:  Await biopsy reports.  The patient will call me for results and  follow-up with me as an outpatient as needed.           ______________________________  Waverly Ferrari, M.D.     GMO/MEDQ  D:  12/18/2007  T:  12/18/2007  Job:  [id number]

## 2011-01-01 NOTE — Op Note (Signed)
NAMECLEMENS, [name] NO.:  [id number]   MEDICAL RECORD NO.:  [id number]          PATIENT TYPE:  AMB   LOCATION:  [facility]                         FACILITY:  [facility]   PHYSICIAN:  Susa Day, M.D.    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   POSTOPERATIVE DIAGNOSES:  1.  Medial meniscus tear and gouty arthropathy of the left knee.  2.  Chondromalacia of the patellofemoral joint.   PROCEDURES PERFORMED:  Left knee arthroscopy, debridement, chondroplasty of  the patella and femoral condyle, partial medial meniscectomy.   ANESTHESIA:  General.   No assistant.   BRIEF HISTORY AND INDICATIONS:  A 45 year old with knee pain with giving  away, MRI indicating medial meniscus tear and a history of gout consistent  with gouty arthropathy refractory to conservative treatment.  Operative  intervention is indicated for partial medial meniscectomy and debridement.  The risks and benefits have been discussed, including bleeding, infection,  damage to vascular structures, no change in symptoms, worsening of symptoms,  need for repeat debridement in the future, etc.   TECHNIQUE:  The patient in supine position after the induction of adequate  general anesthesia and 1 g of Kefzol was placed in the supine position.  The  left lower extremity was prepped and draped in the usual sterile fashion.  A  lateral parapatellar portal and a superomedial parapatellar portal was  fashioned with a #11 blade, ingress cannula atraumatically placed, irrigant  was utilized to insufflate the joint.  Under direct visualization a medial  parapatellar portal was fashioned with a #11 blade after localization with  an 18-gauge needle, sparing the medial meniscus.  First examination revealed  a gouty deposit throughout the  joint.  There was a complex tear of the  posterior portion of the medial meniscus.  Chondromalacia of the  patellofemoral joint was noted.  The lateral compartment was unremarkable  and the meniscus stable to probe palpation without evidence of a tear.  The  lateral compartment, though, had gouty deposits noted.  I introduced a  shaver and utilized it to perform a chondroplasty of the patella and  performed a partial medial meniscectomy to a stable base by straight and  upbiting pituitary and a 4.2 cuda shaver to a stable base.  Approximately  50% of the posterior third of the meniscus had to be resected.  This was in  the inner ring.  The remnant then was stable to probe palpation following  this.  A combination of debridement with the shaver and with a neural hook  and displacing the gouty deposits, I then as best as possible to displace  the gouty deposits and evaluated them with a 4.2 cuda shaver.  Following  this, the gutters were examined, and they were unremarkable.  The meniscal  remnants were unremarkable for a displaced fragment.  The ACL and PCL were  examined and the PCL showed no  evidence of tearing from the medial view.  The ACL showed what appeared to be a slight anterior substance tear, but  under anesthesia he had a negative [name] and no empty wall sign.   Next the knee was copiously lavaged, all instrumentation was removed, and  the portals were closed with 4-0 nylon simple sutures, 0.25% Marcaine with  epinephrine was infiltrated in the joint, and the wound was dressed  sterilely.  He was awoken without difficulty and transported to the recovery  room in satisfactory condition.   The patient tolerated the procedure well with no complication.      JB/MEDQ  D:  12/16/2004  T:  12/16/2004  Job:  [id number]

## 2011-02-19 ENCOUNTER — Other Ambulatory Visit: Payer: Self-pay | Admitting: Gastroenterology

## 2011-04-27 ENCOUNTER — Ambulatory Visit (INDEPENDENT_AMBULATORY_CARE_PROVIDER_SITE_OTHER): Payer: 59 | Admitting: Gastroenterology

## 2011-04-27 ENCOUNTER — Encounter: Payer: Self-pay | Admitting: Gastroenterology

## 2011-04-27 DIAGNOSIS — K219 Gastro-esophageal reflux disease without esophagitis: Secondary | ICD-10-CM

## 2011-04-27 DIAGNOSIS — K5150 Left sided colitis without complications: Secondary | ICD-10-CM | POA: Insufficient documentation

## 2011-04-27 MED ORDER — SULFASALAZINE 500 MG PO TABS: 1500.0000 mg | ORAL_TABLET | Freq: Two times a day (BID) | ORAL | Status: DC

## 2011-04-27 NOTE — Assessment & Plan Note (Addendum)
Recent flareup was likely due to the patient missing several doses of Azulfidine. He is back in clinical remission.

## 2011-04-27 NOTE — Assessment & Plan Note (Signed)
Symptoms well controlled with omeprazole

## 2011-04-27 NOTE — Patient Instructions (Signed)
Follow up as needed

## 2011-04-27 NOTE — Progress Notes (Signed)
History of Present Illness:  Mr. Jessop has returned for evaluation of diarrhea and bleeding. He has a history of left-sided colitis. About a month ago he developed some loose stools and bleeding. This was coincidental with him missing several doses of Azulfidine. In the last 2 weeks symptoms have entirely subsided. He is now having normal bowel movements. Bleeding has entirely resolved. He denies pyrosis but has noted slight increase in excess belching postprandially. He remains on omeprazole daily.    Review of Systems: Pertinent positive and negative review of systems were noted in the above HPI section. All other review of systems were otherwise negative.    Current Medications, Allergies, Past Medical History, Past Surgical History, Family History and Social History were reviewed in [facility] record  Vital signs were reviewed in today's medical record. Physical Exam: General: Well developed , well nourished, no acute distress Head: Normocephalic and atraumatic Eyes:  sclerae anicteric, EOMI Ears: Normal auditory acuity Mouth: No deformity or lesions Lungs: Clear throughout to auscultation Heart: Regular rate and rhythm; no rubs or bruits; there is a 3-7/9 early systolic murmur Abdomen: Soft, non tender and non distended. No masses, hepatosplenomegaly or hernias noted. Normal Bowel sounds Rectal:deferred Musculoskeletal: Symmetrical with no gross deformities  Pulses:  Normal pulses noted Extremities: No clubbing, cyanosis, edema or deformities noted Neurological: Alert oriented x 4, grossly nonfocal Psychological:  Alert and cooperative. Normal mood and affect

## 2012-01-04 ENCOUNTER — Telehealth: Payer: Self-pay | Admitting: Gastroenterology

## 2012-01-04 NOTE — Telephone Encounter (Signed)
Wife reports pt is having rectal bleeding. Requesting pt be seen. Pt scheduled to see Amy Esterwood PA 01/07/12@8 :30am. Pt aware of appt date and time.

## 2012-01-07 ENCOUNTER — Ambulatory Visit (INDEPENDENT_AMBULATORY_CARE_PROVIDER_SITE_OTHER): Payer: BC Managed Care – PPO | Admitting: Physician Assistant

## 2012-01-07 ENCOUNTER — Encounter: Payer: Self-pay | Admitting: Physician Assistant

## 2012-01-07 VITALS — BP 142/90 | HR 58 | Ht 71.0 in | Wt 237.4 lb

## 2012-01-07 DIAGNOSIS — K625 Hemorrhage of anus and rectum: Secondary | ICD-10-CM

## 2012-01-07 DIAGNOSIS — K5150 Left sided colitis without complications: Secondary | ICD-10-CM

## 2012-01-07 MED ORDER — BUDESONIDE 9 MG PO TB24: 1.0000 | ORAL_TABLET | Freq: Every morning | ORAL | Status: DC

## 2012-01-07 NOTE — Progress Notes (Signed)
Subjective:    Patient ID: [name], male    DOB: 1965/09/15, 46 y.o.   MRN: [id number]  HPI Uri is a very nice 46 year old white male known to Dr. Deatra Ina with diagnosis of left-sided ulcerative colitis. He has been maintained on Azulfidine over the past couple of years at 3 g per day. His last procedure was done in April of 2011 with flexible sigmoidoscopy which did show an active minimal left-sided colitis. Prior to that he had had colonoscopy with Dr. Lajoyce Corners about 5 years ago at which time he was diagnosed. Patient relates that he has required a couple of courses of prednisone him and he has never been hospitalized there  He states that he has been taking his medication regularly though does admit that he may miss a dose here or there. Now over the past month he has had increased urgency for bowel movements abdominal cramping which is been mild but persistent. He is also having 5-6 bowel movements per day of loose stool containing bright red blood. His appetite has been fine his weight has been stable he has no complaints of nausea or vomiting. No fever or chills and his energy level has been normal for him. Been on any new medications antibiotics supplements etc.  The patient did have a routine physical done last week at Dr. Pennie Banter office, was told that his stool was Hemoccult positive at that time. He says that all of his labs were "normal".    Review of Systems  Constitutional: Negative.   HENT: Negative.   Eyes: Negative.   Respiratory: Negative.   Cardiovascular: Negative.   Gastrointestinal: Positive for abdominal pain, diarrhea and blood in stool.  Genitourinary: Negative.   Musculoskeletal: Negative.   Neurological: Negative.   Hematological: Negative.   Psychiatric/Behavioral: Negative.    Outpatient Prescriptions Prior to Visit  Medication Sig Dispense Refill  . allopurinol (ZYLOPRIM) 300 MG tablet Take 300 mg by mouth daily.        Marland Kitchen amLODipine (NORVASC) 5 MG tablet  Take 10 mg by mouth daily.       Marland Kitchen losartan (COZAAR) 100 MG tablet Take 100 mg by mouth daily.        Marland Kitchen omeprazole (PRILOSEC) 20 MG capsule Take 20 mg by mouth daily.        Marland Kitchen sulfaSALAzine (AZULFIDINE) 500 MG tablet Take 3 tablets (1,500 mg total) by mouth 2 (two) times daily.  180 tablet  12   No Known Allergies Patient Active Problem List  Diagnoses  . ESSENTIAL HYPERTENSION, BENIGN  . UNSPECIFIED ULCERATIVE COLITIS  . Left sided ulcerative (chronic) colitis  . Esophageal reflux   History   Social History  . Marital Status: Married    Spouse Name: N/A    Number of Children: 3  . Years of Education: N/A   Occupational History  . SUPERVISOR    Social History Main Topics  . Smoking status: Former Smoker    Quit date: 08/16/2005  . Smokeless tobacco: Never Used  . Alcohol Use: 21.6 oz/week    36 Cans of beer per week  . Drug Use: No  . Sexually Active: Not on file   Other Topics Concern  . Not on file   Social History Narrative  . No narrative on file       Objective:   Physical Exam well-developed white male in no acute distress, pleasant blood pressure 142/90 pulse 58 height 5 foot 11 weight 237. HEENT; nontraumatic normocephalic EOMI PERRLA sclera  anicteric conjunctiva pink, Neck; supple no JVD, Cardiovascular; regular rate and rhythm with S1-S2 no murmur or gallop, Pulmonary; clear bilaterally, Abdomen; soft I is tender in the left mid quadrant left lower quadrant there is no guarding no rebound no palpable mass or hepatosplenomegaly bowel sounds are active, Rectal; exam not done, Extremities; no clubbing cyanosis or edema, Psych mood and affect normal and appropriate.        Assessment & Plan:  #[age] year old male with known left-sided ulcerative colitis, maintained on chronic Azulfidine now presenting with one month history of exacerbation with mild abdominal cramping diarrhea and rectal bleeding. Plan; increase Azulfidine 500 mg; 4 tablets by mouth twice daily    Trial of Uceris 9 mg once daily. Patient was given samples today and also a prescription. Would plan for 6-8 week course. Patient asked to call back in about 2 weeks with a progress report and if at that time he is not clearly improved, will need to consider colonoscopy with Dr. Deatra Ina and prednisone.

## 2012-01-07 NOTE — Patient Instructions (Signed)
We sent prescription for Uceris 9 mg to [facility], [place], [place], samples given also. Increase the Sulfasalazine ( Azulfidine) 500 mg to 4 tablets twice daily.  Call if symptoms worsen at any point. Call in 2 weeks with a progress report.  You may ask for Bethzaida Boord at [phone].

## 2012-01-07 NOTE — Progress Notes (Signed)
I agree with excellent assessment and plan per Amy Esterwood,PA.

## 2012-01-14 ENCOUNTER — Other Ambulatory Visit: Payer: Self-pay | Admitting: Gastroenterology

## 2012-01-14 MED ORDER — SULFASALAZINE 500 MG PO TABS: 2000.0000 mg | ORAL_TABLET | Freq: Two times a day (BID) | ORAL | Status: DC

## 2012-01-14 NOTE — Telephone Encounter (Signed)
Personally called in script dose increase of Sulfasalazine to [facility]   Patient aware

## 2012-03-01 ENCOUNTER — Other Ambulatory Visit: Payer: Self-pay | Admitting: *Deleted

## 2012-05-15 ENCOUNTER — Telehealth: Payer: Self-pay | Admitting: Gastroenterology

## 2012-05-15 MED ORDER — SULFASALAZINE 500 MG PO TABS: 2000.0000 mg | ORAL_TABLET | Freq: Two times a day (BID) | ORAL | Status: DC

## 2012-05-15 NOTE — Telephone Encounter (Signed)
Changed from [facility] to [facility] in [place]. 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

## 2012-07-10 ENCOUNTER — Encounter (HOSPITAL_COMMUNITY): Payer: Self-pay | Admitting: Certified Registered Nurse Anesthetist

## 2012-07-10 ENCOUNTER — Encounter (HOSPITAL_COMMUNITY): Payer: Self-pay | Admitting: *Deleted

## 2012-07-10 ENCOUNTER — Encounter (HOSPITAL_COMMUNITY)
Admission: RE | Disposition: A | Discharge status: Home / Self Care | Payer: Self-pay | Source: Ambulatory Visit | Attending: Orthopedic Surgery

## 2012-07-10 ENCOUNTER — Ambulatory Visit (HOSPITAL_COMMUNITY): Payer: 59

## 2012-07-10 ENCOUNTER — Other Ambulatory Visit (HOSPITAL_COMMUNITY): Payer: Self-pay | Admitting: Orthopedic Surgery

## 2012-07-10 ENCOUNTER — Ambulatory Visit (HOSPITAL_COMMUNITY)
Admission: RE | Admit: 2012-07-10 | Discharge: 2012-07-11 | Disposition: A | Discharge status: Home / Self Care | Payer: 59 | Source: Ambulatory Visit | Attending: Orthopedic Surgery | Admitting: Orthopedic Surgery

## 2012-07-10 ENCOUNTER — Ambulatory Visit (HOSPITAL_COMMUNITY): Payer: 59 | Admitting: Certified Registered Nurse Anesthetist

## 2012-07-10 DIAGNOSIS — Y9341 Activity, dancing: Secondary | ICD-10-CM | POA: Insufficient documentation

## 2012-07-10 DIAGNOSIS — K219 Gastro-esophageal reflux disease without esophagitis: Secondary | ICD-10-CM | POA: Insufficient documentation

## 2012-07-10 DIAGNOSIS — S8263XA Displaced fracture of lateral malleolus of unspecified fibula, initial encounter for closed fracture: Secondary | ICD-10-CM

## 2012-07-10 DIAGNOSIS — I10 Essential (primary) hypertension: Secondary | ICD-10-CM | POA: Insufficient documentation

## 2012-07-10 DIAGNOSIS — X500XXA Overexertion from strenuous movement or load, initial encounter: Secondary | ICD-10-CM | POA: Insufficient documentation

## 2012-07-10 DIAGNOSIS — Z79899 Other long term (current) drug therapy: Secondary | ICD-10-CM | POA: Insufficient documentation

## 2012-07-10 DIAGNOSIS — Y929 Unspecified place or not applicable: Secondary | ICD-10-CM | POA: Insufficient documentation

## 2012-07-10 HISTORY — DX: Unspecified osteoarthritis, unspecified site: M19.90

## 2012-07-10 HISTORY — PX: ORIF ANKLE FRACTURE: SHX5408

## 2012-07-10 LAB — BASIC METABOLIC PANEL
BUN: 12 mg/dL (ref 6–23)
GFR calc Af Amer: >90 mL/min (ref >90)
GFR calc non Af Amer: >90 mL/min (ref >90)
Potassium: 4.6 mEq/L (ref 3.5–5.1)
Sodium: 142 mEq/L (ref 135–145)

## 2012-07-10 LAB — SURGICAL PCR SCREEN
MRSA, PCR: NEGATIVE
Staphylococcus aureus: POSITIVE — AB

## 2012-07-10 LAB — CBC
Hemoglobin: 14.7 g/dL (ref 13.0–17.0)
MCHC: 34.6 g/dL (ref 30.0–36.0)
RDW: 12.3 % (ref 11.5–15.5)
WBC: 7.1 10*3/uL (ref 4.0–10.5)

## 2012-07-10 SURGERY — OPEN REDUCTION INTERNAL FIXATION (ORIF) ANKLE FRACTURE
Anesthesia: General | Site: Ankle | Laterality: Left

## 2012-07-10 MED ORDER — HYDROCODONE-ACETAMINOPHEN 5-325 MG PO TABS: 1.0000 | ORAL_TABLET | ORAL | Status: DC | PRN

## 2012-07-10 MED ORDER — METOCLOPRAMIDE HCL 10 MG PO TABS: 5.0000 mg | ORAL_TABLET | Freq: Three times a day (TID) | ORAL | Status: DC | PRN

## 2012-07-10 MED ORDER — METHOCARBAMOL 100 MG/ML IJ SOLN
500.0000 mg | Freq: Four times a day (QID) | INTRAVENOUS | Status: DC | PRN
  Filled 2012-07-10: qty 5

## 2012-07-10 MED ORDER — ARTIFICIAL TEARS OP OINT
TOPICAL_OINTMENT | OPHTHALMIC | Status: DC | PRN
  Administered 2012-07-10: 1 via OPHTHALMIC

## 2012-07-10 MED ORDER — CEFAZOLIN SODIUM-DEXTROSE 2-3 GM-% IV SOLR
2.0000 g | Freq: Four times a day (QID) | INTRAVENOUS | Status: AC
  Administered 2012-07-10 – 2012-07-11 (×3): 2 g via INTRAVENOUS
  Filled 2012-07-10 (×3): qty 50

## 2012-07-10 MED ORDER — LOSARTAN POTASSIUM 50 MG PO TABS
100.0000 mg | ORAL_TABLET | Freq: Every day | ORAL | Status: DC
  Administered 2012-07-11: 100 mg via ORAL
  Filled 2012-07-10: qty 2

## 2012-07-10 MED ORDER — CEFAZOLIN SODIUM-DEXTROSE 2-3 GM-% IV SOLR
INTRAVENOUS | Status: DC | PRN
  Administered 2012-07-10: 2 g via INTRAVENOUS

## 2012-07-10 MED ORDER — SULFASALAZINE 500 MG PO TABS
2000.0000 mg | ORAL_TABLET | Freq: Two times a day (BID) | ORAL | Status: DC
  Administered 2012-07-11 (×2): 2000 mg via ORAL
  Filled 2012-07-10 (×3): qty 4

## 2012-07-10 MED ORDER — MUPIROCIN 2 % EX OINT
TOPICAL_OINTMENT | CUTANEOUS | Status: AC
  Filled 2012-07-10: qty 22

## 2012-07-10 MED ORDER — ONDANSETRON HCL 4 MG PO TABS: 4.0000 mg | ORAL_TABLET | Freq: Four times a day (QID) | ORAL | Status: DC | PRN

## 2012-07-10 MED ORDER — LACTATED RINGERS IV SOLN
INTRAVENOUS | Status: DC | PRN
  Administered 2012-07-10: 19:00:00 via INTRAVENOUS

## 2012-07-10 MED ORDER — ONDANSETRON HCL 4 MG/2ML IJ SOLN: 4.0000 mg | Freq: Four times a day (QID) | INTRAMUSCULAR | Status: DC | PRN

## 2012-07-10 MED ORDER — OXYCODONE-ACETAMINOPHEN 5-325 MG PO TABS
1.0000 | ORAL_TABLET | ORAL | Status: DC | PRN
  Administered 2012-07-11 (×3): 2 via ORAL
  Filled 2012-07-10 (×3): qty 2

## 2012-07-10 MED ORDER — CEFAZOLIN SODIUM-DEXTROSE 2-3 GM-% IV SOLR
INTRAVENOUS | Status: AC
  Filled 2012-07-10: qty 50

## 2012-07-10 MED ORDER — HYDROMORPHONE HCL PF 1 MG/ML IJ SOLN
0.5000 mg | INTRAMUSCULAR | Status: DC | PRN
  Administered 2012-07-10: 1 mg via INTRAVENOUS
  Filled 2012-07-10: qty 1

## 2012-07-10 MED ORDER — PANTOPRAZOLE SODIUM 40 MG PO TBEC
40.0000 mg | DELAYED_RELEASE_TABLET | Freq: Every day | ORAL | Status: DC
  Administered 2012-07-11: 40 mg via ORAL
  Filled 2012-07-10: qty 1

## 2012-07-10 MED ORDER — CEFAZOLIN SODIUM-DEXTROSE 2-3 GM-% IV SOLR: 2.0000 g | INTRAVENOUS | Status: DC

## 2012-07-10 MED ORDER — OXYCODONE HCL 5 MG PO TABS: 5.0000 mg | ORAL_TABLET | Freq: Once | ORAL | Status: DC | PRN

## 2012-07-10 MED ORDER — PROPOFOL 10 MG/ML IV BOLUS
INTRAVENOUS | Status: DC | PRN
  Administered 2012-07-10: 200 mg via INTRAVENOUS

## 2012-07-10 MED ORDER — AMLODIPINE BESYLATE 10 MG PO TABS
10.0000 mg | ORAL_TABLET | Freq: Every day | ORAL | Status: DC
  Administered 2012-07-11: 10 mg via ORAL
  Filled 2012-07-10: qty 1

## 2012-07-10 MED ORDER — OXYCODONE HCL 5 MG/5ML PO SOLN: 5.0000 mg | Freq: Once | ORAL | Status: DC | PRN

## 2012-07-10 MED ORDER — MIDAZOLAM HCL 5 MG/5ML IJ SOLN
INTRAMUSCULAR | Status: DC | PRN
  Administered 2012-07-10: 2 mg via INTRAVENOUS

## 2012-07-10 MED ORDER — ONDANSETRON HCL 4 MG/2ML IJ SOLN
INTRAMUSCULAR | Status: DC | PRN
  Administered 2012-07-10: 4 mg via INTRAVENOUS

## 2012-07-10 MED ORDER — FENTANYL CITRATE 0.05 MG/ML IJ SOLN
INTRAMUSCULAR | Status: DC | PRN
  Administered 2012-07-10: 150 ug via INTRAVENOUS
  Administered 2012-07-10 (×2): 50 ug via INTRAVENOUS

## 2012-07-10 MED ORDER — SODIUM CHLORIDE 0.9 % IV SOLN: INTRAVENOUS | Status: DC

## 2012-07-10 MED ORDER — MIDAZOLAM HCL 2 MG/2ML IJ SOLN: 0.5000 mg | Freq: Once | INTRAMUSCULAR | Status: DC | PRN

## 2012-07-10 MED ORDER — PROMETHAZINE HCL 25 MG/ML IJ SOLN: 6.2500 mg | INTRAMUSCULAR | Status: DC | PRN

## 2012-07-10 MED ORDER — 0.9 % SODIUM CHLORIDE (POUR BTL) OPTIME
TOPICAL | Status: DC | PRN
  Administered 2012-07-10: 300 mL

## 2012-07-10 MED ORDER — HYDROMORPHONE HCL PF 1 MG/ML IJ SOLN
0.2500 mg | INTRAMUSCULAR | Status: DC | PRN
  Administered 2012-07-10: 0.5 mg via INTRAVENOUS

## 2012-07-10 MED ORDER — MEPERIDINE HCL 25 MG/ML IJ SOLN: 6.2500 mg | INTRAMUSCULAR | Status: DC | PRN

## 2012-07-10 MED ORDER — METHOCARBAMOL 500 MG PO TABS: 500.0000 mg | ORAL_TABLET | Freq: Four times a day (QID) | ORAL | Status: DC | PRN

## 2012-07-10 MED ORDER — HYDROMORPHONE HCL PF 1 MG/ML IJ SOLN
INTRAMUSCULAR | Status: DC | PRN
  Administered 2012-07-10: 1 mg via INTRAVENOUS

## 2012-07-10 MED ORDER — ATENOLOL 50 MG PO TABS
50.0000 mg | ORAL_TABLET | Freq: Every day | ORAL | Status: DC
  Administered 2012-07-11: 50 mg via ORAL
  Filled 2012-07-10: qty 1

## 2012-07-10 MED ORDER — ALLOPURINOL 300 MG PO TABS
300.0000 mg | ORAL_TABLET | Freq: Every day | ORAL | Status: DC
  Administered 2012-07-11: 300 mg via ORAL
  Filled 2012-07-10: qty 1

## 2012-07-10 MED ORDER — LIDOCAINE HCL 1 % IJ SOLN
INTRAMUSCULAR | Status: DC | PRN
  Administered 2012-07-10: 20 mg via INTRADERMAL

## 2012-07-10 MED ORDER — METOCLOPRAMIDE HCL 5 MG/ML IJ SOLN: 5.0000 mg | Freq: Three times a day (TID) | INTRAMUSCULAR | Status: DC | PRN

## 2012-07-10 SURGICAL SUPPLY — 37 items
BANDAGE GAUZE ELAST BULKY 4 IN (GAUZE/BANDAGES/DRESSINGS) ×1 IMPLANT
BNDG COHESIVE 4X5 TAN STRL (GAUZE/BANDAGES/DRESSINGS) ×2 IMPLANT
CLOTH BEACON ORANGE TIMEOUT ST (SAFETY) ×2 IMPLANT
COVER SURGICAL LIGHT HANDLE (MISCELLANEOUS) ×2 IMPLANT
DRAPE PROXIMA HALF (DRAPES) ×2 IMPLANT
DRAPE U-SHAPE 47X51 STRL (DRAPES) ×2 IMPLANT
DRSG ADAPTIC 3X8 NADH LF (GAUZE/BANDAGES/DRESSINGS) ×2 IMPLANT
DRSG PAD ABDOMINAL 8X10 ST (GAUZE/BANDAGES/DRESSINGS) ×1 IMPLANT
DURAPREP 26ML APPLICATOR (WOUND CARE) ×2 IMPLANT
ELECT REM PT RETURN 9FT ADLT (ELECTROSURGICAL) ×2
ELECTRODE REM PT RTRN 9FT ADLT (ELECTROSURGICAL) ×1 IMPLANT
GLOVE BIOGEL PI IND STRL 9 (GLOVE) ×1 IMPLANT
GLOVE BIOGEL PI INDICATOR 9 (GLOVE) ×1
GLOVE ECLIPSE 6.5 STRL STRAW (GLOVE) ×1 IMPLANT
GLOVE SURG ORTHO 9.0 STRL STRW (GLOVE) ×2 IMPLANT
GOWN BRE IMP SLV AUR LG STRL (GOWN DISPOSABLE) ×1 IMPLANT
GOWN PREVENTION PLUS XLARGE (GOWN DISPOSABLE) ×2 IMPLANT
KIT BASIN OR (CUSTOM PROCEDURE TRAY) ×2 IMPLANT
KIT ROOM TURNOVER OR (KITS) ×2 IMPLANT
NS IRRIG 1000ML POUR BTL (IV SOLUTION) ×2 IMPLANT
PACK ORTHO EXTREMITY (CUSTOM PROCEDURE TRAY) ×2 IMPLANT
PAD ARMBOARD 7.5X6 YLW CONV (MISCELLANEOUS) ×4 IMPLANT
PLATE LOCK 7H 92 BILAT FIB (Plate) ×1 IMPLANT
SCREW CORTICAL 3.5MM  20MM (Screw) ×1 IMPLANT
SCREW CORTICAL 3.5MM 20MM (Screw) IMPLANT
SCREW CORTICAL 3.5MM 22MM (Screw) ×1 IMPLANT
SCREW LOCK CORT STAR 3.5X10 (Screw) ×1 IMPLANT
SCREW LOCK CORT STAR 3.5X12 (Screw) ×1 IMPLANT
SCREW LOW PROFILE 12MMX3.5MM (Screw) ×3 IMPLANT
SPONGE GAUZE 4X4 12PLY (GAUZE/BANDAGES/DRESSINGS) ×2 IMPLANT
SPONGE LAP 18X18 X RAY DECT (DISPOSABLE) ×2 IMPLANT
STAPLER VISISTAT 35W (STAPLE) ×1 IMPLANT
SUCTION FRAZIER TIP 10 FR DISP (SUCTIONS) ×2 IMPLANT
SUT VIC AB 2-0 CTB1 (SUTURE) ×4 IMPLANT
TOWEL OR 17X24 6PK STRL BLUE (TOWEL DISPOSABLE) ×2 IMPLANT
TOWEL OR 17X26 10 PK STRL BLUE (TOWEL DISPOSABLE) ×2 IMPLANT
TUBE CONNECTING 12X1/4 (SUCTIONS) ×2 IMPLANT

## 2012-07-10 NOTE — Preoperative (Signed)
Beta Blockers   Reason not to administer Beta Blockers:Took atenolol 07/10/12.

## 2012-07-10 NOTE — Anesthesia Postprocedure Evaluation (Signed)
  Anesthesia Post-op Note  Patient: [name]  Procedure(s) Performed: Procedure(s) (LRB) with comments: OPEN REDUCTION INTERNAL FIXATION (ORIF) ANKLE FRACTURE (Left) - Open Reduction Internal Fixation Left Fibula  Patient Location: PACU  Anesthesia Type:General  Level of Consciousness: awake, alert , oriented and patient cooperative  Airway and Oxygen Therapy: Patient Spontanous Breathing and Patient connected to nasal cannula oxygen  Post-op Pain: mild  Post-op Assessment: Post-op Vital signs reviewed, Patient's Cardiovascular Status Stable, Respiratory Function Stable, Patent Airway, No signs of Nausea or vomiting and Pain level controlled  Post-op Vital Signs: Reviewed and stable  Complications: No apparent anesthesia complications

## 2012-07-10 NOTE — Transfer of Care (Signed)
Immediate Anesthesia Transfer of Care Note  Patient: [name]  Procedure(s) Performed: Procedure(s) (LRB) with comments: OPEN REDUCTION INTERNAL FIXATION (ORIF) ANKLE FRACTURE (Left) - Open Reduction Internal Fixation Left Fibula  Patient Location: PACU  Anesthesia Type:General  Level of Consciousness: awake, alert , oriented and patient cooperative  Airway & Oxygen Therapy: Patient Spontanous Breathing and Patient connected to nasal cannula oxygen  Post-op Assessment: Report given to PACU RN, Post -op Vital signs reviewed and stable and Patient moving all extremities  Post vital signs: Reviewed and stable  Complications: No apparent anesthesia complications

## 2012-07-10 NOTE — Op Note (Signed)
OPERATIVE REPORT  DATE OF SURGERY: 07/10/2012  PATIENT:  [name],  46 y.o. male  PRE-OPERATIVE DIAGNOSIS:  Weber B Fibula Fracture L ankle  POST-OPERATIVE DIAGNOSIS:  Weber B Fibula Fracture L ankle  PROCEDURE:  Procedure(s): OPEN REDUCTION INTERNAL FIXATION (ORIF) ANKLE FRACTURE  SURGEON:  Surgeon(s): Newt Minion, MD  ANESTHESIA:   general  EBL:  Minimal ML  SPECIMEN:  No Specimen  TOURNIQUET:  * No tourniquets in log *  PROCEDURE DETAILS: Patient is a 46 year old gentleman who sustained a supination external rotation injury to his left ankle while dancing. Patient had a displaced mortise and presents at this time for open reduction internal fixation. Risks and benefits were discussed including infection neurovascular injury DVT arthritis need for additional surgery. Patient states he understands and wished to proceed at this time. Description of procedure patient brought to the operating room and underwent a general anesthetic. After adequate levels of anesthesia were obtained patient's left lower extremity was prepped using DuraPrep and draped into a sterile field. A lateral incision was carried down to the fibula subperiosteal dissection was used to cleanse the fracture the fracture was irrigated reduced and stabilized with a lag screw. A locking plate was then placed laterally with 2 locking screws distally and 3 cortical contrast pressure and screws proximally. The anterior fragment of the fibula was then secured with lag screw technique as well. C-arm fluoroscopy verified reduction in congruence of the mortise. The wound was irrigated with normal saline subcutaneous is closed in 2-0 Vicryl the skin was closed using staples. Wound is covered with Adaptic orthopedic sponges AB dressing Kerlix and Coban. Patient was extubated taken the PACU in stable condition.  PLAN OF CARE: Admit for overnight observation  PATIENT DISPOSITION:  PACU - hemodynamically stable.     Newt Minion, MD 07/10/2012 8:15 PM

## 2012-07-10 NOTE — H&P (Signed)
[name] is an 46 y.o. male.   Chief Complaint: Left ankle Weber B. fibular fracture. HPI: Patient is a 46 year old gentleman who states he fell dancing sustaining the ankle fracture.  Past Medical History  Diagnosis Date  . Unspecified essential hypertension   . Colitis   . Gout   . GERD (gastroesophageal reflux disease)   . Arthritis     Past Surgical History  Procedure Date  . Knee arthroscopy   . Wrist ganglion excision   . Hand surgery     Family History  Problem Relation Age of Onset  . Adopted: Yes   Social History:  reports that he quit smoking about 6 years ago. He has never used smokeless tobacco. He reports that he drinks about 21.6 ounces of alcohol per week. He reports that he does not use illicit drugs.  Allergies: No Known Allergies  Medications Prior to Admission  Medication Sig Dispense Refill  . allopurinol (ZYLOPRIM) 300 MG tablet Take 300 mg by mouth daily.        Marland Kitchen amLODipine (NORVASC) 5 MG tablet Take 10 mg by mouth daily.       Marland Kitchen atenolol (TENORMIN) 50 MG tablet Take 50 mg by mouth daily.      Marland Kitchen losartan (COZAAR) 100 MG tablet Take 100 mg by mouth daily.        Marland Kitchen omeprazole (PRILOSEC) 20 MG capsule Take 20 mg by mouth daily.        Marland Kitchen sulfaSALAzine (AZULFIDINE) 500 MG tablet Take 4 tablets (2,000 mg total) by mouth 2 (two) times daily.  720 tablet  3    Results for orders placed during the hospital encounter of 07/10/12 (from the past 48 hour(s))  SURGICAL PCR SCREEN     Status: Abnormal   Collection Time   07/10/12  4:35 PM      Component Value Range Comment   MRSA, PCR NEGATIVE  NEGATIVE    Staphylococcus aureus POSITIVE (*) NEGATIVE   BASIC METABOLIC PANEL     Status: Abnormal   Collection Time   07/10/12  4:46 PM      Component Value Range Comment   Sodium 142  135 - 145 mEq/L    Potassium 4.6  3.5 - 5.1 mEq/L    Chloride 104  96 - 112 mEq/L    CO2 28  19 - 32 mEq/L    Glucose, Bld 110 (*) 70 - 99 mg/dL    BUN 12  6 - 23 mg/dL      Creatinine, Ser 0.97  0.50 - 1.35 mg/dL    Calcium 9.4  8.4 - 10.5 mg/dL    GFR calc non Af Amer >90  >90 mL/min    GFR calc Af Amer >90  >90 mL/min   CBC     Status: Normal   Collection Time   07/10/12  4:46 PM      Component Value Range Comment   WBC 7.1  4.0 - 10.5 K/uL    RBC 4.59  4.22 - 5.81 MIL/uL    Hemoglobin 14.7  13.0 - 17.0 g/dL    HCT 42.5  39.0 - 52.0 %    MCV 92.6  78.0 - 100.0 fL    MCH 32.0  26.0 - 34.0 pg    MCHC 34.6  30.0 - 36.0 g/dL    RDW 12.3  11.5 - 15.5 %    Platelets 233  150 - 400 K/uL    Dg Chest 2 View  07/10/2012  *RADIOLOGY REPORT*  Clinical Data: Preoperative evaluation.  History of hypertension.  CHEST - 2 VIEW  Comparison: None.  Findings: There is moderate enlargement of the cardiac silhouette. No mediastinal or hilar enlargement is evident.  No pulmonary infiltrates or masses are evident. No pleural abnormality is evident. Bones appear average for age.  IMPRESSION: There is moderate enlargement cardiac silhouette.  No pulmonary edema, pneumonia, or other acute abnormality is evident.   Original Report Authenticated By: Shanon Brow Call     Review of Systems  All other systems reviewed and are negative.    Blood pressure 168/113, pulse 66, temperature 98.7 F (37.1 C), temperature source Oral, resp. rate 20, height 5' 11"  (1.803 m), weight 106.595 kg (235 lb), SpO2 99.00%. Physical Exam  Patient has a good dorsalis pedis pulse the deltoid is nontender to palpation the syndesmosis is nontender he has ecchymosis and bruising laterally there were no blisters. Radiographs shows a Weber B. fibular fracture. Assessment/Plan  assessment: Weber B. left fibular fracture.  Plan: We'll plan for open reduction internal fixation. Risks and benefits were discussed including infection neurovascular injury persistent pain DVT need for additional surgery. Patient states he understands was to proceed at this time.  DUDA,MARCUS V 07/10/2012, 6:56 PM

## 2012-07-10 NOTE — Anesthesia Preprocedure Evaluation (Signed)
Anesthesia Evaluation  Patient identified by MRN, date of birth, ID band Patient awake    Reviewed: Allergy & Precautions, H&P , NPO status , Patient's Chart, lab work & pertinent test results, reviewed documented beta blocker date and time   History of Anesthesia Complications Negative for: history of anesthetic complications  Airway Mallampati: I TM Distance: >3 FB Neck ROM: Full    Dental  (+) Teeth Intact and Dental Advisory Given   Pulmonary former smoker,  breath sounds clear to auscultation  Pulmonary exam normal       Cardiovascular hypertension, Pt. on medications and Pt. on home beta blockers negative cardio ROS  Rhythm:Regular Rate:Normal     Neuro/Psych negative neurological ROS     GI/Hepatic Neg liver ROS, PUD, GERD-  Medicated and Controlled,  Endo/Other  Morbid obesity  Renal/GU negative Renal ROS     Musculoskeletal   Abdominal (+) + obese,   Peds  Hematology   Anesthesia Other Findings   Reproductive/Obstetrics                           Anesthesia Physical Anesthesia Plan  ASA: II  Anesthesia Plan: General   Post-op Pain Management:    Induction: Intravenous  Airway Management Planned: LMA  Additional Equipment:   Intra-op Plan:   Post-operative Plan: Extubation in OR  Informed Consent: I have reviewed the patients History and Physical, chart, labs and discussed the procedure including the risks, benefits and alternatives for the proposed anesthesia with the patient or authorized representative who has indicated his/her understanding and acceptance.   Dental advisory given  Plan Discussed with: Surgeon and CRNA  Anesthesia Plan Comments: (Plan routine monitors, GA- LMA OK)        Anesthesia Quick Evaluation

## 2012-07-11 ENCOUNTER — Encounter (HOSPITAL_COMMUNITY): Payer: Self-pay | Admitting: Orthopedic Surgery

## 2012-07-11 MED ORDER — INFLUENZA VIRUS VACC SPLIT PF IM SUSP
0.5000 mL | Freq: Once | INTRAMUSCULAR | Status: AC
  Administered 2012-07-11: 0.5 mL via INTRAMUSCULAR
  Filled 2012-07-11: qty 0.5

## 2012-07-11 MED ORDER — HYDROCODONE-ACETAMINOPHEN 5-500 MG PO TABS: 1.0000 | ORAL_TABLET | Freq: Four times a day (QID) | ORAL | Status: DC | PRN

## 2012-07-11 MED FILL — Mupirocin Oint 2%: CUTANEOUS | Qty: 22 | Status: AC

## 2012-07-11 NOTE — Evaluation (Signed)
Physical Therapy Evaluation Patient Details Name: [name] MRN: [id number] DOB: 1965-10-25 Today's Date: 07/11/2012 Time: 7867-5449 PT Time Calculation (min): 40 min  PT Assessment / Plan / Recommendation Clinical Impression  Pt is a 46 y/o male s/p ORIF L Ankle.  Pt demonstrates safety and modified independence with all mobility.  No further acute PT needs.     PT Assessment  Patent does not need any further PT services    Follow Up Recommendations  No PT follow up    Does the patient have the potential to tolerate intense rehabilitation      Barriers to Discharge        Equipment Recommendations  3 in 1 bedside comode    Recommendations for Other Services     Frequency      Precautions / Restrictions Precautions Required Braces or Orthoses: Other Brace/Splint Other Brace/Splint: Fracture boot.  Restrictions Weight Bearing Restrictions: Yes LLE Weight Bearing: Non weight bearing   Pertinent Vitals/Pain 3/10 pain in L foot and ankle.  Pt medicated prior to session.        Mobility  Bed Mobility Bed Mobility: Supine to Sit;Sit to Supine Supine to Sit: 7: Independent Sit to Supine: 7: Independent Transfers Transfers: Sit to Stand;Stand to Sit Sit to Stand: 6: Modified independent (Device/Increase time);From bed;From chair/3-in-1;With upper extremity assist Stand to Sit: 6: Modified independent (Device/Increase time);With upper extremity assist;To bed;To chair/3-in-1 Ambulation/Gait Ambulation/Gait Assistance: 6: Modified independent (Device/Increase time) Ambulation Distance (Feet): 50 Feet Assistive device: Crutches Ambulation/Gait Assistance Details: No assist requried.  Gait Pattern: Step-through pattern General Gait Details: Pt demonstrating NWB on LLE Stairs: Yes Stairs Assistance: 6: Modified independent (Device/Increase time) Stair Management Technique: One rail Left;With crutches Number of Stairs: 3  Wheelchair Mobility Wheelchair Mobility:  No    Shoulder Instructions     Exercises     PT Diagnosis:    PT Problem List:   PT Treatment Interventions:     PT Goals Acute Rehab PT Goals PT Goal Formulation: With patient Time For Goal Achievement: 07/11/12  Visit Information  Last PT Received On: 07/11/12    Subjective Data  Subjective: Agree to PT eval.    Prior Functioning  Home Living Lives With: Spouse;Son;Daughter;Family Available Help at Discharge: Family Type of Home: Mobile home Home Access: Stairs to enter Technical brewer of Steps: 5 Entrance Stairs-Rails: Left Home Layout: One level Bathroom Shower/Tub: Product/process development scientist: Standard Bathroom Accessibility: Yes How Accessible: Accessible via walker Home Adaptive Equipment: None Prior Function Level of Independence: Independent Able to Take Stairs?: Yes Driving: Yes Vocation: Full time employment Communication Communication: No difficulties Dominant Hand: Right    Cognition  Overall Cognitive Status: Appears within functional limits for tasks assessed/performed Arousal/Alertness: Awake/alert Orientation Level: Appears intact for tasks assessed Behavior During Session: [facility] for tasks performed    Extremity/Trunk Assessment Right Upper Extremity Assessment RUE ROM/Strength/Tone: Within functional levels Left Upper Extremity Assessment LUE ROM/Strength/Tone: Within functional levels Right Lower Extremity Assessment RLE ROM/Strength/Tone: Within functional levels Left Lower Extremity Assessment LLE ROM/Strength/Tone: Unable to fully assess Trunk Assessment Trunk Assessment: Normal   Balance    End of Session PT - End of Session Equipment Utilized During Treatment: Gait belt Activity Tolerance: Patient tolerated treatment well Patient left: in bed;with call bell/phone within reach;with family/visitor present Nurse Communication: Mobility status  GP     Bonner Larue 07/11/2012, 1:34 PM  Meryem Haertel L. Harrold Fitchett DPT  [phone]

## 2012-07-11 NOTE — Discharge Summary (Signed)
Physician Discharge Summary  Patient ID: ARBY DAHIR MRN: [id number] DOB/AGE: [birth year] 46 y.o.  Admit date: 07/10/2012 Discharge date: 07/11/2012  Admission Diagnoses: Left ankle fracture  Discharge Diagnoses: Same Active Problems:  * No active hospital problems. *    Discharged Condition: stable  Hospital Course: Patient's hospital course was essentially unremarkable he underwent open reduction internal fixation of the left fibula and was discharged to home in stable condition.  Consults: None  Significant Diagnostic Studies: labs: Routine labs  Treatments: surgery: See operative note  Discharge Exam: Blood pressure 140/89, pulse 65, temperature 97.1 F (36.2 C), temperature source Oral, resp. rate 18, height 5' 11"  (1.803 m), weight 106.595 kg (235 lb), SpO2 98.00%. Incision/Wound: dressing clean dry and intact  Disposition: Final discharge disposition not confirmed     Medication List     As of 07/11/2012  6:54 AM    ASK your doctor about these medications         allopurinol 300 MG tablet   Commonly known as: ZYLOPRIM   Take 300 mg by mouth daily.      amLODipine 5 MG tablet   Commonly known as: NORVASC   Take 10 mg by mouth daily.      atenolol 50 MG tablet   Commonly known as: TENORMIN   Take 50 mg by mouth daily.      losartan 100 MG tablet   Commonly known as: COZAAR   Take 100 mg by mouth daily.      omeprazole 20 MG capsule   Commonly known as: PRILOSEC   Take 20 mg by mouth daily.      sulfaSALAzine 500 MG tablet   Commonly known as: AZULFIDINE   Take 4 tablets (2,000 mg total) by mouth 2 (two) times daily.           Follow-up Information    Follow up with DUDA,MARCUS V, MD. In 2 weeks.   Contact information:   [place] [address] [phone]          Signed: Newt Minion 07/11/2012, 6:54 AM

## 2012-09-08 ENCOUNTER — Other Ambulatory Visit: Payer: Self-pay | Admitting: Gastroenterology

## 2012-09-08 DIAGNOSIS — K5190 Ulcerative colitis, unspecified, without complications: Secondary | ICD-10-CM

## 2012-09-15 ENCOUNTER — Other Ambulatory Visit (INDEPENDENT_AMBULATORY_CARE_PROVIDER_SITE_OTHER): Payer: 59

## 2012-09-15 DIAGNOSIS — K5190 Ulcerative colitis, unspecified, without complications: Secondary | ICD-10-CM

## 2012-09-15 LAB — COMPREHENSIVE METABOLIC PANEL
AST: 32 U/L (ref 0–37)
Albumin: 4.1 g/dL (ref 3.5–5.2)
Alkaline Phosphatase: 23 U/L — ABNORMAL LOW (ref 39–117)
BUN: 17 mg/dL (ref 6–23)
Creatinine, Ser: 1.2 mg/dL (ref 0.4–1.5)
Potassium: 4.4 mEq/L (ref 3.5–5.1)

## 2012-09-15 LAB — CBC WITH DIFFERENTIAL/PLATELET
Basophils Relative: 0.7 % (ref 0.0–3.0)
Eosinophils Absolute: 0.1 10*3/uL (ref 0.0–0.7)
MCHC: 33.8 g/dL (ref 30.0–36.0)
MCV: 94.1 fl (ref 78.0–100.0)
Monocytes Absolute: 0.8 10*3/uL (ref 0.1–1.0)
Neutrophils Relative %: 52 % (ref 43.0–77.0)
RBC: 4.37 Mil/uL (ref 4.22–5.81)
RDW: 13.7 % (ref 11.5–14.6)

## 2012-09-24 NOTE — Progress Notes (Signed)
Quick Note:  Please inform the patient that lab work was normal and to continue current plan of action ______ 

## 2013-01-15 ENCOUNTER — Other Ambulatory Visit: Payer: Self-pay | Admitting: Gastroenterology

## 2013-01-15 DIAGNOSIS — K51219 Ulcerative (chronic) proctitis with unspecified complications: Secondary | ICD-10-CM

## 2013-02-21 ENCOUNTER — Ambulatory Visit (INDEPENDENT_AMBULATORY_CARE_PROVIDER_SITE_OTHER): Payer: 59 | Admitting: Gastroenterology

## 2013-02-21 ENCOUNTER — Other Ambulatory Visit (INDEPENDENT_AMBULATORY_CARE_PROVIDER_SITE_OTHER): Payer: 59

## 2013-02-21 ENCOUNTER — Encounter: Payer: Self-pay | Admitting: Gastroenterology

## 2013-02-21 VITALS — BP 132/90 | HR 64 | Ht 70.0 in | Wt 231.0 lb

## 2013-02-21 DIAGNOSIS — K5190 Ulcerative colitis, unspecified, without complications: Secondary | ICD-10-CM

## 2013-02-21 DIAGNOSIS — K5150 Left sided colitis without complications: Secondary | ICD-10-CM

## 2013-02-21 LAB — CBC WITH DIFFERENTIAL/PLATELET
Basophils Relative: 0.3 % (ref 0.0–3.0)
Eosinophils Absolute: 0.1 10*3/uL (ref 0.0–0.7)
Hemoglobin: 15.1 g/dL (ref 13.0–17.0)
Lymphs Abs: 1.6 10*3/uL (ref 0.7–4.0)
MCHC: 33.9 g/dL (ref 30.0–36.0)
MCV: 95.3 fl (ref 78.0–100.0)
Monocytes Absolute: 0.6 10*3/uL (ref 0.1–1.0)
Neutro Abs: 4.8 10*3/uL (ref 1.4–7.7)
RBC: 4.67 Mil/uL (ref 4.22–5.81)

## 2013-02-21 LAB — COMPREHENSIVE METABOLIC PANEL
AST: 25 U/L (ref 0–37)
Alkaline Phosphatase: 25 U/L — ABNORMAL LOW (ref 39–117)
BUN: 15 mg/dL (ref 6–23)
Creatinine, Ser: 1 mg/dL (ref 0.4–1.5)

## 2013-02-21 NOTE — Patient Instructions (Addendum)
You will go to the basement for labs today Your Xray for Bone Density is scheduled on 02/26/2013 at 11am in the basement of the [facility] will need to follow up in 6 and 12 months for labs You are receiving a Pneumococcal vaccine today  Follow up in one year

## 2013-02-21 NOTE — Assessment & Plan Note (Signed)
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 Plan to reduce from 2 g to 1500 mg twice a day. We'll check hepatitis serologies and vaccinate accordingly. Plan to obtain a bone density scan and vaccinate the patient with Pneumovax as well.  Check renal function tests every 6 months as well as CBC and LFTs.

## 2013-02-21 NOTE — Progress Notes (Signed)
History of Present Illness: 47 year old white male with left-sided colitis diagnosed approximately 2008 here for routine followup. He's been treated in the past with Azulfidine. At last visit in May, 2013 he received an 8 week course of  uceris for a mild flare. Since that time he has felt well. Azulfidine was increased from 1500-2 g twice a day. He has no GI complaints except for occasional pyrosis for which she takes Prilosec    Past Medical History  Diagnosis Date  . Unspecified essential hypertension   . Colitis   . Gout   . GERD (gastroesophageal reflux disease)   . Arthritis    Past Surgical History  Procedure Laterality Date  . Knee arthroscopy    . Wrist ganglion excision    . Hand surgery    . Orif ankle fracture  07/10/2012    Procedure: OPEN REDUCTION INTERNAL FIXATION (ORIF) ANKLE FRACTURE;  Surgeon: Newt Minion, MD;  Location: [place];  Service: Orthopedics;  Laterality: Left;  Open Reduction Internal Fixation Left Fibula   family history is not on file. He is adopted. Current Outpatient Prescriptions  Medication Sig Dispense Refill  . allopurinol (ZYLOPRIM) 300 MG tablet Take 300 mg by mouth daily.        Marland Kitchen amLODipine (NORVASC) 5 MG tablet Take 10 mg by mouth daily.       Marland Kitchen atenolol (TENORMIN) 50 MG tablet Take 50 mg by mouth daily.      Marland Kitchen losartan (COZAAR) 100 MG tablet Take 100 mg by mouth daily.        Marland Kitchen omeprazole (PRILOSEC) 20 MG capsule Take 20 mg by mouth daily.        . sertraline (ZOLOFT) 100 MG tablet Take 100 mg by mouth daily.       Marland Kitchen sulfaSALAzine (AZULFIDINE) 500 MG tablet Take 4 tablets (2,000 mg total) by mouth 2 (two) times daily.  720 tablet  3   No current facility-administered medications for this visit.   Allergies as of 02/21/2013  . (No Known Allergies)    reports that he quit smoking about 7 years ago. He has never used smokeless tobacco. He reports that he drinks about 21.6 ounces of alcohol per week. He reports that he does not use illicit  drugs.     Review of Systems: Pertinent positive and negative review of systems were noted in the above HPI section. All other review of systems were otherwise negative.  Vital signs were reviewed in today's medical record Physical Exam: General: Well developed , well nourished, no acute distress Skin: anicteric Head: Normocephalic and atraumatic Eyes:  sclerae anicteric, EOMI Ears: Normal auditory acuity Mouth: No deformity or lesions Neck: Supple, no masses or thyromegaly Lungs: Clear throughout to auscultation Heart: Regular rate and rhythm; no murmurs, rubs or bruits Abdomen: Soft, non tender and non distended. No masses, hepatosplenomegaly or hernias noted. Normal Bowel sounds Rectal:deferred Musculoskeletal: Symmetrical with no gross deformities  Skin: No lesions on visible extremities Pulses:  Normal pulses noted Extremities: No clubbing, cyanosis, edema or deformities noted Neurological: Alert oriented x 4, grossly nonfocal Cervical Nodes:  No significant cervical adenopathy Inguinal Nodes: No significant inguinal adenopathy Psychological:  Alert and cooperative. Normal mood and affect

## 2013-02-22 LAB — HEPATITIS B SURFACE ANTIGEN: Hepatitis B Surface Ag: NEGATIVE

## 2013-02-22 LAB — HEPATITIS C ANTIBODY: HCV Ab: NEGATIVE

## 2013-02-22 LAB — HEPATITIS B CORE ANTIBODY, IGM: Hep B C IgM: NEGATIVE

## 2013-02-23 ENCOUNTER — Ambulatory Visit: Payer: 59 | Admitting: Gastroenterology

## 2013-02-26 ENCOUNTER — Other Ambulatory Visit: Payer: 59

## 2013-02-28 ENCOUNTER — Telehealth: Payer: Self-pay | Admitting: Gastroenterology

## 2013-02-28 NOTE — Telephone Encounter (Signed)
R/S Bone Density for pt to 03/09/13 at 0930am; pt aware.

## 2013-03-09 ENCOUNTER — Ambulatory Visit (INDEPENDENT_AMBULATORY_CARE_PROVIDER_SITE_OTHER)
Admission: RE | Admit: 2013-03-09 | Discharge: 2013-03-09 | Disposition: A | Discharge status: Home / Self Care | Payer: 59 | Source: Ambulatory Visit | Attending: Gastroenterology | Admitting: Gastroenterology

## 2013-03-09 DIAGNOSIS — K5190 Ulcerative colitis, unspecified, without complications: Secondary | ICD-10-CM

## 2013-06-21 ENCOUNTER — Other Ambulatory Visit: Payer: Self-pay

## 2013-07-14 ENCOUNTER — Inpatient Hospital Stay (HOSPITAL_COMMUNITY): Payer: BC Managed Care – PPO

## 2013-07-14 ENCOUNTER — Emergency Department (HOSPITAL_COMMUNITY): Payer: BC Managed Care – PPO

## 2013-07-14 ENCOUNTER — Encounter (HOSPITAL_COMMUNITY): Payer: Self-pay | Admitting: Emergency Medicine

## 2013-07-14 ENCOUNTER — Inpatient Hospital Stay (HOSPITAL_COMMUNITY): Payer: BC Managed Care – PPO | Admitting: Anesthesiology

## 2013-07-14 ENCOUNTER — Inpatient Hospital Stay (HOSPITAL_COMMUNITY)
Admission: EM | Admit: 2013-07-14 | Discharge: 2013-07-16 | DRG: 494 | Disposition: A | Discharge status: Home / Self Care | Payer: BC Managed Care – PPO | Attending: Orthopedic Surgery | Admitting: Orthopedic Surgery

## 2013-07-14 ENCOUNTER — Encounter (HOSPITAL_COMMUNITY): Payer: BC Managed Care – PPO | Admitting: Anesthesiology

## 2013-07-14 ENCOUNTER — Encounter (HOSPITAL_COMMUNITY)
Admission: EM | Disposition: A | Discharge status: Home / Self Care | Payer: Self-pay | Source: Home / Self Care | Attending: Orthopedic Surgery

## 2013-07-14 DIAGNOSIS — S82209A Unspecified fracture of shaft of unspecified tibia, initial encounter for closed fracture: Principal | ICD-10-CM | POA: Diagnosis present

## 2013-07-14 DIAGNOSIS — F1010 Alcohol abuse, uncomplicated: Secondary | ICD-10-CM | POA: Diagnosis present

## 2013-07-14 DIAGNOSIS — M109 Gout, unspecified: Secondary | ICD-10-CM | POA: Diagnosis present

## 2013-07-14 DIAGNOSIS — I10 Essential (primary) hypertension: Secondary | ICD-10-CM | POA: Diagnosis present

## 2013-07-14 DIAGNOSIS — Z87891 Personal history of nicotine dependence: Secondary | ICD-10-CM

## 2013-07-14 DIAGNOSIS — Y92009 Unspecified place in unspecified non-institutional (private) residence as the place of occurrence of the external cause: Secondary | ICD-10-CM

## 2013-07-14 DIAGNOSIS — E669 Obesity, unspecified: Secondary | ICD-10-CM | POA: Diagnosis present

## 2013-07-14 DIAGNOSIS — S82301A Unspecified fracture of lower end of right tibia, initial encounter for closed fracture: Secondary | ICD-10-CM

## 2013-07-14 DIAGNOSIS — Z79899 Other long term (current) drug therapy: Secondary | ICD-10-CM

## 2013-07-14 DIAGNOSIS — F10929 Alcohol use, unspecified with intoxication, unspecified: Secondary | ICD-10-CM | POA: Diagnosis present

## 2013-07-14 DIAGNOSIS — S82201A Unspecified fracture of shaft of right tibia, initial encounter for closed fracture: Secondary | ICD-10-CM

## 2013-07-14 DIAGNOSIS — M129 Arthropathy, unspecified: Secondary | ICD-10-CM | POA: Diagnosis present

## 2013-07-14 DIAGNOSIS — W19XXXA Unspecified fall, initial encounter: Secondary | ICD-10-CM | POA: Diagnosis present

## 2013-07-14 DIAGNOSIS — M899 Disorder of bone, unspecified: Secondary | ICD-10-CM | POA: Diagnosis present

## 2013-07-14 DIAGNOSIS — Z23 Encounter for immunization: Secondary | ICD-10-CM

## 2013-07-14 DIAGNOSIS — K219 Gastro-esophageal reflux disease without esophagitis: Secondary | ICD-10-CM | POA: Diagnosis present

## 2013-07-14 HISTORY — PX: IM NAILING TIBIA: SUR734

## 2013-07-14 HISTORY — DX: Unspecified fracture of shaft of right tibia, initial encounter for closed fracture: S82.201A

## 2013-07-14 HISTORY — PX: TIBIA IM NAIL INSERTION: SHX2516

## 2013-07-14 LAB — BASIC METABOLIC PANEL
BUN: 11 mg/dL (ref 6–23)
CO2: 21 mEq/L (ref 19–32)
Chloride: 100 mEq/L (ref 96–112)
GFR calc Af Amer: >90 mL/min (ref >90)
GFR calc non Af Amer: >90 mL/min (ref >90)
Glucose, Bld: 122 mg/dL — ABNORMAL HIGH (ref 70–99)
Potassium: 3.9 mEq/L (ref 3.5–5.1)

## 2013-07-14 LAB — CBC
HCT: 46.8 % (ref 39.0–52.0)
Hemoglobin: 16.2 g/dL (ref 13.0–17.0)
MCV: 96.5 fL (ref 78.0–100.0)
RBC: 4.85 MIL/uL (ref 4.22–5.81)
RDW: 12.9 % (ref 11.5–15.5)
WBC: 7.4 10*3/uL (ref 4.0–10.5)

## 2013-07-14 LAB — MRSA PCR SCREENING: MRSA by PCR: NEGATIVE

## 2013-07-14 SURGERY — INSERTION, INTRAMEDULLARY ROD, TIBIA
Anesthesia: General | Laterality: Right | Wound class: Clean

## 2013-07-14 MED ORDER — POTASSIUM CHLORIDE IN NACL 20-0.45 MEQ/L-% IV SOLN
INTRAVENOUS | Status: DC
  Filled 2013-07-14 (×5): qty 1000

## 2013-07-14 MED ORDER — OXYCODONE HCL 5 MG PO TABS
5.0000 mg | ORAL_TABLET | ORAL | Status: DC | PRN
  Administered 2013-07-14 – 2013-07-15 (×3): 10 mg via ORAL
  Filled 2013-07-14 (×3): qty 2

## 2013-07-14 MED ORDER — FENTANYL CITRATE 0.05 MG/ML IJ SOLN: 50.0000 ug | Freq: Once | INTRAMUSCULAR | Status: DC

## 2013-07-14 MED ORDER — HYDROMORPHONE HCL PF 1 MG/ML IJ SOLN
INTRAMUSCULAR | Status: AC
  Filled 2013-07-14: qty 1

## 2013-07-14 MED ORDER — ADULT MULTIVITAMIN W/MINERALS CH
1.0000 | ORAL_TABLET | Freq: Every day | ORAL | Status: DC
  Administered 2013-07-15 – 2013-07-16 (×2): 1 via ORAL
  Filled 2013-07-14 (×3): qty 1

## 2013-07-14 MED ORDER — BUPIVACAINE HCL (PF) 0.25 % IJ SOLN
INTRAMUSCULAR | Status: AC
  Filled 2013-07-14: qty 30

## 2013-07-14 MED ORDER — VITAMIN B-1 100 MG PO TABS
100.0000 mg | ORAL_TABLET | Freq: Every day | ORAL | Status: DC
  Administered 2013-07-15 – 2013-07-16 (×2): 100 mg via ORAL
  Filled 2013-07-14 (×3): qty 1

## 2013-07-14 MED ORDER — SERTRALINE HCL 100 MG PO TABS
100.0000 mg | ORAL_TABLET | Freq: Every day | ORAL | Status: DC
  Administered 2013-07-15 – 2013-07-16 (×2): 100 mg via ORAL
  Filled 2013-07-14 (×3): qty 1

## 2013-07-14 MED ORDER — SENNA 8.6 MG PO TABS
1.0000 | ORAL_TABLET | Freq: Two times a day (BID) | ORAL | Status: DC
  Administered 2013-07-14 – 2013-07-16 (×4): 8.6 mg via ORAL
  Filled 2013-07-14 (×5): qty 1

## 2013-07-14 MED ORDER — GLYCOPYRROLATE 0.2 MG/ML IJ SOLN
INTRAMUSCULAR | Status: DC | PRN
  Administered 2013-07-14: .7 mg via INTRAVENOUS

## 2013-07-14 MED ORDER — LACTATED RINGERS IV SOLN
INTRAVENOUS | Status: DC | PRN
  Administered 2013-07-14 (×2): via INTRAVENOUS

## 2013-07-14 MED ORDER — DOCUSATE SODIUM 100 MG PO CAPS
100.0000 mg | ORAL_CAPSULE | Freq: Two times a day (BID) | ORAL | Status: DC
  Administered 2013-07-14 – 2013-07-16 (×4): 100 mg via ORAL
  Filled 2013-07-14 (×4): qty 1

## 2013-07-14 MED ORDER — OXYCODONE HCL 5 MG PO TABS: 5.0000 mg | ORAL_TABLET | Freq: Once | ORAL | Status: DC | PRN

## 2013-07-14 MED ORDER — MIDAZOLAM HCL 2 MG/2ML IJ SOLN: 1.0000 mg | INTRAMUSCULAR | Status: DC | PRN

## 2013-07-14 MED ORDER — PROMETHAZINE HCL 25 MG/ML IJ SOLN: 6.2500 mg | INTRAMUSCULAR | Status: DC | PRN

## 2013-07-14 MED ORDER — 0.9 % SODIUM CHLORIDE (POUR BTL) OPTIME
TOPICAL | Status: DC | PRN
  Administered 2013-07-14: 1000 mL

## 2013-07-14 MED ORDER — METHOCARBAMOL 500 MG PO TABS: 500.0000 mg | ORAL_TABLET | Freq: Four times a day (QID) | ORAL | Status: DC

## 2013-07-14 MED ORDER — SENNA-DOCUSATE SODIUM 8.6-50 MG PO TABS: 2.0000 | ORAL_TABLET | Freq: Every day | ORAL | Status: DC

## 2013-07-14 MED ORDER — ALLOPURINOL 300 MG PO TABS
300.0000 mg | ORAL_TABLET | Freq: Every day | ORAL | Status: DC
  Administered 2013-07-15 – 2013-07-16 (×2): 300 mg via ORAL
  Filled 2013-07-14 (×3): qty 1

## 2013-07-14 MED ORDER — ASPIRIN EC 325 MG PO TBEC: 325.0000 mg | DELAYED_RELEASE_TABLET | Freq: Every day | ORAL | Status: DC

## 2013-07-14 MED ORDER — THIAMINE HCL 100 MG/ML IJ SOLN
100.0000 mg | Freq: Every day | INTRAMUSCULAR | Status: DC
  Filled 2013-07-14 (×3): qty 1

## 2013-07-14 MED ORDER — ONDANSETRON HCL 4 MG/2ML IJ SOLN
INTRAMUSCULAR | Status: DC | PRN
  Administered 2013-07-14: 4 mg via INTRAVENOUS

## 2013-07-14 MED ORDER — ROCURONIUM BROMIDE 100 MG/10ML IV SOLN
INTRAVENOUS | Status: DC | PRN
  Administered 2013-07-14: 30 mg via INTRAVENOUS
  Administered 2013-07-14: 10 mg via INTRAVENOUS

## 2013-07-14 MED ORDER — LOSARTAN POTASSIUM 50 MG PO TABS
100.0000 mg | ORAL_TABLET | Freq: Every day | ORAL | Status: DC
  Administered 2013-07-15 – 2013-07-16 (×2): 100 mg via ORAL
  Filled 2013-07-14 (×3): qty 2

## 2013-07-14 MED ORDER — LORAZEPAM 2 MG/ML IJ SOLN: 1.0000 mg | Freq: Four times a day (QID) | INTRAMUSCULAR | Status: DC | PRN

## 2013-07-14 MED ORDER — ZOLPIDEM TARTRATE 5 MG PO TABS: 5.0000 mg | ORAL_TABLET | Freq: Every evening | ORAL | Status: DC | PRN

## 2013-07-14 MED ORDER — LIDOCAINE HCL (CARDIAC) 20 MG/ML IV SOLN
INTRAVENOUS | Status: DC | PRN
  Administered 2013-07-14: 75 mg via INTRAVENOUS

## 2013-07-14 MED ORDER — AMLODIPINE BESYLATE 10 MG PO TABS
10.0000 mg | ORAL_TABLET | Freq: Every day | ORAL | Status: DC
  Administered 2013-07-15 – 2013-07-16 (×2): 10 mg via ORAL
  Filled 2013-07-14 (×3): qty 1

## 2013-07-14 MED ORDER — METHOCARBAMOL 100 MG/ML IJ SOLN
500.0000 mg | Freq: Four times a day (QID) | INTRAVENOUS | Status: DC | PRN
  Filled 2013-07-14: qty 5

## 2013-07-14 MED ORDER — ENOXAPARIN SODIUM 40 MG/0.4ML ~~LOC~~ SOLN
40.0000 mg | SUBCUTANEOUS | Status: DC
  Administered 2013-07-15 – 2013-07-16 (×2): 40 mg via SUBCUTANEOUS
  Filled 2013-07-14 (×3): qty 0.4

## 2013-07-14 MED ORDER — PANTOPRAZOLE SODIUM 40 MG PO TBEC
80.0000 mg | DELAYED_RELEASE_TABLET | Freq: Every day | ORAL | Status: DC
  Administered 2013-07-15 – 2013-07-16 (×2): 80 mg via ORAL
  Filled 2013-07-14 (×2): qty 2

## 2013-07-14 MED ORDER — NEOSTIGMINE METHYLSULFATE 1 MG/ML IJ SOLN
INTRAMUSCULAR | Status: DC | PRN
  Administered 2013-07-14: 4 mg via INTRAVENOUS

## 2013-07-14 MED ORDER — POTASSIUM CHLORIDE IN NACL 20-0.45 MEQ/L-% IV SOLN
INTRAVENOUS | Status: DC
  Administered 2013-07-14 – 2013-07-15 (×2): via INTRAVENOUS
  Filled 2013-07-14 (×5): qty 1000

## 2013-07-14 MED ORDER — HYDROMORPHONE HCL PF 1 MG/ML IJ SOLN
0.2500 mg | INTRAMUSCULAR | Status: DC | PRN
  Administered 2013-07-14 (×4): 0.5 mg via INTRAVENOUS

## 2013-07-14 MED ORDER — LORAZEPAM 1 MG PO TABS: 1.0000 mg | ORAL_TABLET | Freq: Four times a day (QID) | ORAL | Status: DC | PRN

## 2013-07-14 MED ORDER — METHOCARBAMOL 500 MG PO TABS
500.0000 mg | ORAL_TABLET | Freq: Four times a day (QID) | ORAL | Status: DC | PRN
  Administered 2013-07-14: 500 mg via ORAL
  Filled 2013-07-14: qty 1

## 2013-07-14 MED ORDER — PROMETHAZINE HCL 25 MG PO TABS: 25.0000 mg | ORAL_TABLET | Freq: Four times a day (QID) | ORAL | Status: DC | PRN

## 2013-07-14 MED ORDER — PROPOFOL 10 MG/ML IV BOLUS
INTRAVENOUS | Status: DC | PRN
  Administered 2013-07-14: 200 mg via INTRAVENOUS

## 2013-07-14 MED ORDER — ONDANSETRON HCL 4 MG PO TABS: 4.0000 mg | ORAL_TABLET | Freq: Four times a day (QID) | ORAL | Status: DC | PRN

## 2013-07-14 MED ORDER — EPHEDRINE SULFATE 50 MG/ML IJ SOLN
INTRAMUSCULAR | Status: DC | PRN
  Administered 2013-07-14 (×2): 10 mg via INTRAVENOUS

## 2013-07-14 MED ORDER — FENTANYL CITRATE 0.05 MG/ML IJ SOLN
50.0000 ug | INTRAMUSCULAR | Status: DC | PRN
  Administered 2013-07-14 (×5): 50 ug via INTRAVENOUS
  Filled 2013-07-14 (×5): qty 2

## 2013-07-14 MED ORDER — FOLIC ACID 1 MG PO TABS
1.0000 mg | ORAL_TABLET | Freq: Every day | ORAL | Status: DC
  Administered 2013-07-15 – 2013-07-16 (×2): 1 mg via ORAL
  Filled 2013-07-14 (×3): qty 1

## 2013-07-14 MED ORDER — ONDANSETRON HCL 4 MG/2ML IJ SOLN
4.0000 mg | Freq: Once | INTRAMUSCULAR | Status: AC
  Administered 2013-07-14: 4 mg via INTRAVENOUS
  Filled 2013-07-14: qty 2

## 2013-07-14 MED ORDER — CEFAZOLIN SODIUM-DEXTROSE 2-3 GM-% IV SOLR
2.0000 g | Freq: Four times a day (QID) | INTRAVENOUS | Status: AC
  Administered 2013-07-14 – 2013-07-15 (×3): 2 g via INTRAVENOUS
  Filled 2013-07-14 (×3): qty 50

## 2013-07-14 MED ORDER — OXYCODONE-ACETAMINOPHEN 10-325 MG PO TABS: 1.0000 | ORAL_TABLET | Freq: Four times a day (QID) | ORAL | Status: DC | PRN

## 2013-07-14 MED ORDER — OXYCODONE HCL 5 MG/5ML PO SOLN
5.0000 mg | Freq: Once | ORAL | Status: DC | PRN
Start: 2013-07-14 — End: 2013-07-14

## 2013-07-14 MED ORDER — CEFAZOLIN SODIUM-DEXTROSE 2-3 GM-% IV SOLR
INTRAVENOUS | Status: DC | PRN
  Administered 2013-07-14: 2 g via INTRAVENOUS

## 2013-07-14 MED ORDER — CEFAZOLIN SODIUM-DEXTROSE 2-3 GM-% IV SOLR
2.0000 g | INTRAVENOUS | Status: AC
  Administered 2013-07-14: 2 g via INTRAVENOUS
  Filled 2013-07-14: qty 50

## 2013-07-14 MED ORDER — ATENOLOL 50 MG PO TABS
50.0000 mg | ORAL_TABLET | Freq: Two times a day (BID) | ORAL | Status: DC
  Administered 2013-07-14 – 2013-07-16 (×4): 50 mg via ORAL
  Filled 2013-07-14 (×6): qty 1

## 2013-07-14 MED ORDER — SODIUM CHLORIDE 0.9 % IV SOLN
INTRAVENOUS | Status: DC
  Administered 2013-07-14: 02:00:00 via INTRAVENOUS

## 2013-07-14 MED ORDER — METOCLOPRAMIDE HCL 5 MG PO TABS
5.0000 mg | ORAL_TABLET | Freq: Three times a day (TID) | ORAL | Status: DC | PRN
  Filled 2013-07-14: qty 2

## 2013-07-14 MED ORDER — OXYCODONE-ACETAMINOPHEN 5-325 MG PO TABS
1.0000 | ORAL_TABLET | ORAL | Status: DC | PRN
  Administered 2013-07-14 – 2013-07-16 (×6): 2 via ORAL
  Filled 2013-07-14 (×6): qty 2

## 2013-07-14 MED ORDER — DIPHENHYDRAMINE HCL 12.5 MG/5ML PO ELIX: 12.5000 mg | ORAL_SOLUTION | ORAL | Status: DC | PRN

## 2013-07-14 MED ORDER — BUPIVACAINE HCL (PF) 0.25 % IJ SOLN
INTRAMUSCULAR | Status: DC | PRN
  Administered 2013-07-14: 20 mL

## 2013-07-14 MED ORDER — HYDROMORPHONE HCL PF 1 MG/ML IJ SOLN
0.5000 mg | INTRAMUSCULAR | Status: DC | PRN
  Administered 2013-07-14 – 2013-07-15 (×7): 1 mg via INTRAVENOUS
  Filled 2013-07-14 (×7): qty 1

## 2013-07-14 MED ORDER — METOCLOPRAMIDE HCL 5 MG/ML IJ SOLN: 5.0000 mg | Freq: Three times a day (TID) | INTRAMUSCULAR | Status: DC | PRN

## 2013-07-14 MED ORDER — ONDANSETRON HCL 4 MG/2ML IJ SOLN: 4.0000 mg | Freq: Four times a day (QID) | INTRAMUSCULAR | Status: DC | PRN

## 2013-07-14 MED ORDER — POLYETHYLENE GLYCOL 3350 17 G PO PACK: 17.0000 g | PACK | Freq: Every day | ORAL | Status: DC | PRN

## 2013-07-14 MED ORDER — SULFASALAZINE 500 MG PO TABS
2000.0000 mg | ORAL_TABLET | Freq: Two times a day (BID) | ORAL | Status: DC
  Administered 2013-07-14 – 2013-07-16 (×4): 2000 mg via ORAL
  Filled 2013-07-14 (×5): qty 4

## 2013-07-14 MED ORDER — BISACODYL 10 MG RE SUPP: 10.0000 mg | Freq: Every day | RECTAL | Status: DC | PRN

## 2013-07-14 MED ORDER — SUCCINYLCHOLINE CHLORIDE 20 MG/ML IJ SOLN
INTRAMUSCULAR | Status: DC | PRN
  Administered 2013-07-14: 140 mg via INTRAVENOUS

## 2013-07-14 MED ORDER — CEFAZOLIN SODIUM-DEXTROSE 2-3 GM-% IV SOLR
INTRAVENOUS | Status: AC
  Filled 2013-07-14: qty 50

## 2013-07-14 MED ORDER — FENTANYL CITRATE 0.05 MG/ML IJ SOLN
INTRAMUSCULAR | Status: DC | PRN
  Administered 2013-07-14 (×2): 100 ug via INTRAVENOUS
  Administered 2013-07-14: 50 ug via INTRAVENOUS
  Administered 2013-07-14: 100 ug via INTRAVENOUS

## 2013-07-14 SURGICAL SUPPLY — 67 items
APL SKNCLS STERI-STRIP NONHPOA (GAUZE/BANDAGES/DRESSINGS) ×1
BANDAGE ELASTIC 4 VELCRO ST LF (GAUZE/BANDAGES/DRESSINGS) ×2 IMPLANT
BANDAGE ELASTIC 6 VELCRO ST LF (GAUZE/BANDAGES/DRESSINGS) ×2 IMPLANT
BANDAGE ESMARK 6X9 LF (GAUZE/BANDAGES/DRESSINGS) IMPLANT
BANDAGE GAUZE ELAST BULKY 4 IN (GAUZE/BANDAGES/DRESSINGS) ×2 IMPLANT
BENZOIN TINCTURE PRP APPL 2/3 (GAUZE/BANDAGES/DRESSINGS) ×1 IMPLANT
BIT DRILL 3.8X6 NS (BIT) ×1 IMPLANT
BIT DRILL 4.4 NS (BIT) ×1 IMPLANT
BLADE SURG 15 STRL LF DISP TIS (BLADE) ×1 IMPLANT
BLADE SURG 15 STRL SS (BLADE) ×2
BLADE SURG ROTATE 9660 (MISCELLANEOUS) IMPLANT
BNDG CMPR 9X6 STRL LF SNTH (GAUZE/BANDAGES/DRESSINGS)
BNDG COHESIVE 6X5 TAN STRL LF (GAUZE/BANDAGES/DRESSINGS) ×2 IMPLANT
BNDG ESMARK 6X9 LF (GAUZE/BANDAGES/DRESSINGS)
BOOTCOVER CLEANROOM LRG (PROTECTIVE WEAR) ×4 IMPLANT
CLOTH BEACON ORANGE TIMEOUT ST (SAFETY) ×2 IMPLANT
CLSR STERI-STRIP ANTIMIC 1/2X4 (GAUZE/BANDAGES/DRESSINGS) ×1 IMPLANT
COVER SURGICAL LIGHT HANDLE (MISCELLANEOUS) ×4 IMPLANT
CUFF TOURNIQUET SINGLE 34IN LL (TOURNIQUET CUFF) IMPLANT
CUFF TOURNIQUET SINGLE 44IN (TOURNIQUET CUFF) IMPLANT
DRAPE C-ARM 42X72 X-RAY (DRAPES) ×2 IMPLANT
DRAPE C-ARMOR (DRAPES) ×1 IMPLANT
DRAPE ORTHO SPLIT 77X108 STRL (DRAPES) ×6
DRAPE PROXIMA HALF (DRAPES) ×6 IMPLANT
DRAPE SURG ORHT 6 SPLT 77X108 (DRAPES) ×3 IMPLANT
DRAPE U-SHAPE 47X51 STRL (DRAPES) ×2 IMPLANT
DRSG ADAPTIC 3X8 NADH LF (GAUZE/BANDAGES/DRESSINGS) ×2 IMPLANT
DRSG PAD ABDOMINAL 8X10 ST (GAUZE/BANDAGES/DRESSINGS) ×1 IMPLANT
DURAPREP 26ML APPLICATOR (WOUND CARE) ×2 IMPLANT
ELECT REM PT RETURN 9FT ADLT (ELECTROSURGICAL) ×2
ELECTRODE REM PT RTRN 9FT ADLT (ELECTROSURGICAL) ×1 IMPLANT
FACESHIELD LNG OPTICON STERILE (SAFETY) IMPLANT
GLOVE BIOGEL PI ORTHO PRO SZ8 (GLOVE) ×1
GLOVE ORTHO TXT STRL SZ7.5 (GLOVE) ×2 IMPLANT
GLOVE PI ORTHO PRO STRL SZ8 (GLOVE) ×1 IMPLANT
GLOVE SURG ORTHO 8.0 STRL STRW (GLOVE) ×4 IMPLANT
GOWN STRL NON-REIN LRG LVL3 (GOWN DISPOSABLE) IMPLANT
GUIDEWIRE BALL NOSE 80CM (WIRE) ×1 IMPLANT
KIT BASIN OR (CUSTOM PROCEDURE TRAY) ×2 IMPLANT
KIT ROOM TURNOVER OR (KITS) ×2 IMPLANT
MANIFOLD NEPTUNE II (INSTRUMENTS) ×2 IMPLANT
NAIL TIBIA 9X39.0 (Nail) ×1 IMPLANT
NS IRRIG 1000ML POUR BTL (IV SOLUTION) ×2 IMPLANT
PACK GENERAL/GYN (CUSTOM PROCEDURE TRAY) ×2 IMPLANT
PAD ARMBOARD 7.5X6 YLW CONV (MISCELLANEOUS) ×4 IMPLANT
PAD CAST 4YDX4 CTTN HI CHSV (CAST SUPPLIES) IMPLANT
PADDING CAST COTTON 4X4 STRL (CAST SUPPLIES) ×2
PADDING CAST COTTON 6X4 STRL (CAST SUPPLIES) ×1 IMPLANT
SCREW ACECAP 36MM (Screw) ×1 IMPLANT
SCREW ACECAP 50MM (Screw) ×1 IMPLANT
SCREW PROXIMAL DEPUY (Screw) ×2 IMPLANT
SCREW PRXML FT 45X5.5XLCK NS (Screw) IMPLANT
SPLINT PLASTER CAST XFAST 5X30 (CAST SUPPLIES) IMPLANT
SPLINT PLASTER XFAST SET 5X30 (CAST SUPPLIES) ×1
SPONGE GAUZE 4X4 12PLY (GAUZE/BANDAGES/DRESSINGS) ×3 IMPLANT
STAPLER VISISTAT 35W (STAPLE) ×2 IMPLANT
STOCKINETTE IMPERVIOUS LG (DRAPES) ×2 IMPLANT
SUT MNCRL AB 4-0 PS2 18 (SUTURE) ×2 IMPLANT
SUT VIC AB 0 CT1 18XCR BRD 8 (SUTURE) ×1 IMPLANT
SUT VIC AB 0 CT1 8-18 (SUTURE) ×2
SUT VIC AB 2-0 CT1 27 (SUTURE) ×2
SUT VIC AB 2-0 CT1 TAPERPNT 27 (SUTURE) ×1 IMPLANT
SUT VIC AB 3-0 SH 8-18 (SUTURE) ×2 IMPLANT
TOWEL OR 17X24 6PK STRL BLUE (TOWEL DISPOSABLE) ×2 IMPLANT
TOWEL OR 17X26 10 PK STRL BLUE (TOWEL DISPOSABLE) ×2 IMPLANT
TRAY FOLEY CATH 16FRSI W/METER (SET/KITS/TRAYS/PACK) ×1 IMPLANT
WATER STERILE IRR 1000ML POUR (IV SOLUTION) ×2 IMPLANT

## 2013-07-14 NOTE — H&P (Signed)
PREOPERATIVE H&P  Chief Complaint: Right leg pain  HPI: [name] is a 47 y.o. male who was apparent for bed tonight, walking around the bed, twisted his right ankle/leg, felt a snap, and came into the emergency room by EMS. He had the acute severe pain, difficulty ambulating, no loss of consciousness, no other injuries. He recently had a bone density test, which she thinks is okay. He has broken his left ankle before, and his wife indicates that when he injured the right side tonight, the leg was just "dangling". Pain is currently rated as moderate, and he recently received fentanyl which helped.  Past Medical History  Diagnosis Date  . Unspecified essential hypertension   . Colitis   . Gout   . GERD (gastroesophageal reflux disease)   . Arthritis   . Closed right tibial fracture 07/14/2013   Past Surgical History  Procedure Laterality Date  . Knee arthroscopy    . Wrist ganglion excision    . Hand surgery    . Orif ankle fracture  07/10/2012    Procedure: OPEN REDUCTION INTERNAL FIXATION (ORIF) ANKLE FRACTURE;  Surgeon: Newt Minion, MD;  Location: [place];  Service: Orthopedics;  Laterality: Left;  Open Reduction Internal Fixation Left Fibula   History   Social History  . Marital Status: Married    Spouse Name: N/A    Number of Children: 3  . Years of Education: N/A   Occupational History  . SUPERVISOR    Social History Main Topics  . Smoking status: Former Smoker    Quit date: 08/16/2005  . Smokeless tobacco: Never Used  . Alcohol Use: 21.6 oz/week    36 Cans of beer per week  . Drug Use: No  . Sexual Activity: None   Other Topics Concern  . None   Social History Narrative  . None   Family History  Problem Relation Age of Onset  . Adopted: Yes   No Known Allergies Prior to Admission medications   Medication Sig Start Date End Date Taking? Authorizing Provider  allopurinol (ZYLOPRIM) 300 MG tablet Take 300 mg by mouth daily.     Yes Historical  Provider, MD  amLODipine (NORVASC) 5 MG tablet Take 10 mg by mouth daily.    Yes Historical Provider, MD  atenolol (TENORMIN) 50 MG tablet Take 50 mg by mouth 2 (two) times daily.    Yes Historical Provider, MD  losartan (COZAAR) 100 MG tablet Take 100 mg by mouth daily.     Yes Historical Provider, MD  omeprazole (PRILOSEC) 20 MG capsule Take 20 mg by mouth daily.     Yes Historical Provider, MD  sertraline (ZOLOFT) 100 MG tablet Take 100 mg by mouth daily.  01/16/13  Yes Historical Provider, MD  sulfaSALAzine (AZULFIDINE) 500 MG tablet Take 4 tablets (2,000 mg total) by mouth 2 (two) times daily. 05/15/12  Yes Inda Castle, MD     Positive ROS: All other systems have been reviewed and were otherwise negative with the exception of those mentioned in the HPI and as above.  Physical Exam: General: Alert, no acute distress Cardiovascular: No pedal edema, but there is swelling around the right leg. Respiratory: No cyanosis, no use of accessory musculature GI: No organomegaly, abdomen is soft and non-tender Skin: There no skin breaks, although there is an area of ecchymosis at the apex of the fracture, no open wounds. Neurologic: Sensation intact distally Psychiatric: Patient is competent for consent with normal mood and affect Lymphatic:  No axillary or cervical lymphadenopathy  MUSCULOSKELETAL: Right leg has sensation intact throughout. EHL and FHL are firing. He has intact capillary refill. Clinical alignment demonstrates significant valgus. He has a tibia fracture clinically at the midshaft.  Assessment: Right midshaft tibia and fibula fracture. Acute alcohol intoxication with a blood level of approximately 300 as of 2 hours ago.  Plan: This is an acute severe injury, and carries risks for malunion, and nonunion, among multiple others. I recommended intramedullary nail fixation in order to optimize long-term function and overall alignment. He will have to be optimized from anesthetic  standpoint given his current alcohol intoxication.  Plan to admit him, perform a closed reduction here in the emergency room in order to optimize alignment and soft tissue status, get postreduction x-rays, utilize IV pain medications as needed, keep n.p.o., alcohol withdrawal protocol, and plan for surgery once optimized from alcohol intoxication standpoint.  The risks benefits and alternatives were discussed with the patient including but not limited to the risks of nonoperative treatment, versus surgical intervention including infection, bleeding, nerve injury, malunion, nonunion, the need for revision surgery, hardware prominence, hardware failure, the need for hardware removal, blood clots, cardiopulmonary complications, morbidity, mortality, among others, and they were willing to proceed.    Preprocedure diagnosis: Right midshaft tibia fracture  Post procedure diagnosis: Same  Procedure: Closed reduction, right tibia fracture, with splinting  Procedure details: After informed verbal consent was obtained the right leg was torn off the gurney, and IV fentanyl was given, and the leg was aligned clinically, and then a posterior splint with a U was applied. He was then elevated. Postreduction x-rays are pending. He tolerated this well and we will plan for surgery later today.  Robert Bridge, MD Cell [phone]   07/14/2013 3:00 AM

## 2013-07-14 NOTE — Anesthesia Procedure Notes (Signed)
Procedure Name: Intubation Date/Time: 07/14/2013 10:20 AM Performed by: Eligha Bridegroom Pre-anesthesia Checklist: Emergency Drugs available, Patient identified, Suction available, Patient being monitored and Timeout performed Patient Re-evaluated:Patient Re-evaluated prior to inductionOxygen Delivery Method: Circle system utilized Preoxygenation: Pre-oxygenation with 100% oxygen Intubation Type: IV induction, Rapid sequence and Cricoid Pressure applied Laryngoscope Size: Mac and 4 Grade View: Grade I Tube type: Oral Tube size: 8.0 mm Number of attempts: 1 Airway Equipment and Method: Stylet Placement Confirmation: ETT inserted through vocal cords under direct vision,  breath sounds checked- equal and bilateral and positive ETCO2 Secured at: 22 cm Tube secured with: Tape Dental Injury: Teeth and Oropharynx as per pre-operative assessment

## 2013-07-14 NOTE — Preoperative (Signed)
Beta Blockers   Reason not to administer Beta Blockers:Not Applicable 

## 2013-07-14 NOTE — ED Notes (Addendum)
Patient was at home, walking around his bed, felt a "pop" in his lower right leg and fell to the floor.  Patient denies any head injury.  Patient does have ETOH on board.  Patient is CAOx3.  Patient given 84m of morphine en route to ED.

## 2013-07-14 NOTE — Transfer of Care (Signed)
Immediate Anesthesia Transfer of Care Note  Patient: [name]  Procedure(s) Performed: Procedure(s): INTRAMEDULLARY (IM) NAIL TIBIAL (Right)  Patient Location: PACU  Anesthesia Type:General  Level of Consciousness: awake, alert  and oriented  Airway & Oxygen Therapy: Patient Spontanous Breathing and Patient connected to nasal cannula oxygen  Post-op Assessment: Report given to PACU RN and Post -op Vital signs reviewed and stable  Post vital signs: Reviewed and stable  Complications: No apparent anesthesia complications

## 2013-07-14 NOTE — ED Provider Notes (Signed)
CSN: [id number]     Arrival date & time 07/14/13  0029 History   First MD Initiated Contact with Patient 07/14/13 0029     Chief Complaint  Patient presents with  . Leg Injury   (Consider location/radiation/quality/duration/timing/severity/associated sxs/prior Treatment) HPI History per patient. Right leg injury. Admits to alcohol use tonight. States he was getting ready for bed, walking around his bed and experienced severe sharp pain in his right ankle.  EMS was called, brought in by [facility] with close deformity distal right tib-fib. Patient denies any other injury. No fall. No head or neck injury. No weakness or numbness. Is able to wiggle his toes and denies any numbness or tingling. He has a history of arthritis, colitis, and gout. He is prescribed sulfasalazine, Zoloft, Prilosec, Cozaar, Tenormin, Norvasc, allopurinol.   Past Medical History  Diagnosis Date  . Unspecified essential hypertension   . Colitis   . Gout   . GERD (gastroesophageal reflux disease)   . Arthritis    Past Surgical History  Procedure Laterality Date  . Knee arthroscopy    . Wrist ganglion excision    . Hand surgery    . Orif ankle fracture  07/10/2012    Procedure: OPEN REDUCTION INTERNAL FIXATION (ORIF) ANKLE FRACTURE;  Surgeon: Newt Minion, MD;  Location: [place];  Service: Orthopedics;  Laterality: Left;  Open Reduction Internal Fixation Left Fibula   Family History  Problem Relation Age of Onset  . Adopted: Yes   History  Substance Use Topics  . Smoking status: Former Smoker    Quit date: 08/16/2005  . Smokeless tobacco: Never Used  . Alcohol Use: 21.6 oz/week    36 Cans of beer per week    Review of Systems  Constitutional: Negative for fever and fatigue.  Respiratory: Negative for shortness of breath.   Cardiovascular: Negative for chest pain.  Gastrointestinal: Negative for abdominal pain.  Genitourinary: Negative for flank pain.  Musculoskeletal: Negative for back pain,  neck pain and neck stiffness.  Skin: Negative for rash.  Neurological: Negative for weakness, numbness and headaches.  All other systems reviewed and are negative.    Allergies  Review of patient's allergies indicates no known allergies.  Home Medications   Current Outpatient Rx  Name  Route  Sig  Dispense  Refill  . allopurinol (ZYLOPRIM) 300 MG tablet   Oral   Take 300 mg by mouth daily.           Marland Kitchen amLODipine (NORVASC) 5 MG tablet   Oral   Take 10 mg by mouth daily.          Marland Kitchen atenolol (TENORMIN) 50 MG tablet   Oral   Take 50 mg by mouth daily.         Marland Kitchen losartan (COZAAR) 100 MG tablet   Oral   Take 100 mg by mouth daily.           Marland Kitchen omeprazole (PRILOSEC) 20 MG capsule   Oral   Take 20 mg by mouth daily.           . sertraline (ZOLOFT) 100 MG tablet   Oral   Take 100 mg by mouth daily.          Marland Kitchen sulfaSALAzine (AZULFIDINE) 500 MG tablet   Oral   Take 4 tablets (2,000 mg total) by mouth 2 (two) times daily.   720 tablet   3     Takes 4 pills twice a day (Increase on dosage)  BP 127/81  Pulse 86  Temp(Src) 98.7 F (37.1 C) (Oral)  Resp 18  Ht 5' 11"  (1.803 m)  Wt 226 lb (102.513 kg)  BMI 31.53 kg/m2  SpO2 98% Physical Exam  Constitutional: He is oriented to person, place, and time. He appears well-developed and well-nourished.  HENT:  Head: Normocephalic and atraumatic.  Eyes: EOM are normal. Pupils are equal, round, and reactive to light.  Neck: Neck supple.  No C-spine tenderness or deformity  Cardiovascular: Normal rate, regular rhythm and intact distal pulses.   Pulmonary/Chest: Effort normal and breath sounds normal. No respiratory distress. He exhibits no tenderness.  Abdominal: Soft. He exhibits no distension. There is no tenderness.  Musculoskeletal:  Right lower extremity with obvious deformity distal tibia region with associated swelling, tenderness and ecchymosis. Skin is intact throughout. Equal dorsalis pedis pulses and  distal motor and sensorium intact. No tenderness or the knee or hip. No thoracic or lumbar tenderness or deformity  Neurological: He is alert and oriented to person, place, and time.  Skin: Skin is warm and dry.    ED Course  Procedures (including critical care time) Labs Review Labs Reviewed  BASIC METABOLIC PANEL - Abnormal; Notable for the following:    Glucose, Bld 122 (*)    All other components within normal limits  ETHANOL - Abnormal; Notable for the following:    Alcohol, Ethyl (B) 310 (*)    All other components within normal limits  CBC   Imaging Review Dg Tibia/fibula Right  07/14/2013   CLINICAL DATA:  Pain after a fall.  EXAM: RIGHT TIBIA AND FIBULA - 2 VIEW  COMPARISON:  None.  FINDINGS: Comminuted fractures are demonstrated in the distal shafts of the left tibia and fibula. There is lateral displacement and overriding with lateral angulation of the distal fracture fragments. The proximal tibia and fibula appear otherwise intact. Vascular calcifications consistent with phleboliths.  IMPRESSION: Comminuted fractures in the distal left tibial and fibular shafts.   Electronically Signed   By: Lucienne Capers M.D.   On: 07/14/2013 02:04   Dg Ankle Complete Right  07/14/2013   CLINICAL DATA:  Pain in the distal tib-fib after a fall.  EXAM: RIGHT ANKLE - COMPLETE 3+ VIEW  COMPARISON:  None.  FINDINGS: Oblique comminuted fractures are present in the distal left tibial and fibular shafts. There is overriding, lateral displacement, and lateral angulation of the distal fracture fragments. Associated soft tissue swelling. No underlying bone lesions are appreciated.  IMPRESSION: Comminuted and displaced fractures of the distal left tibial and fibular shafts.   Electronically Signed   By: Lucienne Capers M.D.   On: 07/14/2013 02:03    N.p.o. IV fentanyl. IV Zofran. Right lower Extremity maintained in elevation with EMS splint in place.   X-rays reviewed as above.  PT has history of  left ankle fracture repaired by Dr. Sharol Given. I discussed this with patient and he does not have a preference regarding the repairs his right leg fractures. On-call orthopedics consult.  D/w Mardelle Matte, plan posterior splint now and he will evaluate bedside for admission.  MDM  Dx: Closed right distal tib-fib fractures  Pain control IV narcotics IVFs Labs and imaging as above Ortho admit    Teressa Lower, MD 07/14/13 [phone]

## 2013-07-14 NOTE — Anesthesia Postprocedure Evaluation (Signed)
  Anesthesia Post-op Note  Patient: [name]  Procedure(s) Performed: Procedure(s): INTRAMEDULLARY (IM) NAIL TIBIAL (Right)  Patient Location: PACU  Anesthesia Type:General  Level of Consciousness: awake and alert   Airway and Oxygen Therapy: Patient Spontanous Breathing  Post-op Pain: mild  Post-op Assessment: Post-op Vital signs reviewed, Patient's Cardiovascular Status Stable, Respiratory Function Stable, Patent Airway, No signs of Nausea or vomiting and Pain level controlled  Post-op Vital Signs: Reviewed and stable  Complications: No apparent anesthesia complications

## 2013-07-14 NOTE — Op Note (Signed)
07/14/2013  12:01 PM  PATIENT:  [name]    PRE-OPERATIVE DIAGNOSIS:  right tibial fracture  POST-OPERATIVE DIAGNOSIS:  Same  PROCEDURE:  INTRAMEDULLARY (IM) NAIL TIBIAL  SURGEON:  Johnny Bridge, MD  PHYSICIAN ASSISTANT: Joya Gaskins, OPA-C, present and scrubbed throughout the case, critical for completion in a timely fashion, and for retraction, instrumentation, and closure.  ANESTHESIA:   General  PREOPERATIVE INDICATIONS:  [name] is a  47 y.o. male with a diagnosis of Closed right tibia fracture that occurred after he was trying to angulate to the bed last night, and he was intoxicated, fell and injured his leg. He was admitted to the emergency room, and thenOnce optimized from alcohol intoxication standpoint to the operating room for intramedullary nailing.  The risks benefits and alternatives were discussed with the patient including but not limited to the risks of nonoperative treatment, versus surgical intervention including infection, bleeding, nerve injury, malunion, nonunion, the need for revision surgery, hardware prominence, hardware failure, the need for hardware removal, blood clots, cardiopulmonary complications, morbidity, mortality, among others, and they were willing to proceed.     I also discussed the options for nonsurgical management, however he elected for surgical management in order to optimize long-term function and minimize malunion.  OPERATIVE IMPLANTS: Biomet size 34m x 39 centimeters intramedullary tibial nail with a proximal interlocking bolts and 2 distal interlocking bolts.  OPERATIVE FINDINGS: Mild osteopenia with a grossly unstable tibia fracture  OPERATIVE PROCEDURE: The patient is brought to the operating room and placed in the supine position. General anesthesia was administered. IV antibiotics were given. The right lower extremity was prepped and draped in usual sterile fashion. Time out was performed. The leg was elevated and  exsanguinated and the tourniquet was inflated. Total tourniquet time was approximately one hour and 20 minutes. Anterior patellar tendon incision was performed, and the guide pin was introduced to the proximal tibia. This was confirmed with position on AP and lateral views, and then introduced down into the tibia itself.  I then reamed over the guidepin, removed the guide pin, attempted percutaneous clamp reduction of the fracture site, although the fracture pattern made it very difficult to achieve a clamp reduction.  Therefore I held the fracture manually while BJoya Gaskins orthopedic PA-C advance the guidewire across the fracture site, and then reamed sequentially up to a 10.5.  I then delivered the nail down, and initially the nail was actually causing a fair amount of recurvatum, and I withdrew the nail past the fracture site again, held an additional manual reduction maneuver, and was actually in fact able to correct the recurvatum.  Once the nail was completely seated, I confirmed its position on AP and lateral views, and placed a single bicortical interlocking bolts proximally. I used the medial to lateral oblique screw in order to minimize prominence of the screw head.  I then used perfect circles technique to place the distal 2 screws. Excellent fixation was achieved. The alignment of the fracture was satisfactory.  I then irrigated the wounds copiously, took final C-arm pictures on AP and lateral views, repaired the patellar tendon split using Vicryl, followed by Vicryl for subcutaneous tissue with Steri-Strips and sterile gauze. A posterior splint was applied. He was awakened and returned to the PACU in stable and satisfactory condition. There were no complications.

## 2013-07-14 NOTE — Progress Notes (Signed)
Patient is currently receiving fentanyl for pain management. Patient states that the medication is not strong enough for the pain he is experiencing. MD office called. Ordered to give Dilaudid or Percocet post-op medication doses to patient to help with pain management.

## 2013-07-14 NOTE — Anesthesia Preprocedure Evaluation (Addendum)
Anesthesia Evaluation  Patient identified by MRN, date of birth, ID band Patient awake    Reviewed: Allergy & Precautions, H&P , NPO status , Patient's Chart, lab work & pertinent test results  History of Anesthesia Complications Negative for: history of anesthetic complications  Airway Mallampati: I TM Distance: >3 FB Neck ROM: Full    Dental  (+) Teeth Intact and Dental Advisory Given   Pulmonary former smoker,  breath sounds clear to auscultation  Pulmonary exam normal       Cardiovascular hypertension, Pt. on medications and Pt. on home beta blockers negative cardio ROS  Rhythm:Regular Rate:Normal     Neuro/Psych negative neurological ROS     GI/Hepatic Neg liver ROS, PUD, GERD-  Medicated and Controlled,(+)     substance abuse  alcohol use,   Endo/Other    Renal/GU negative Renal ROS     Musculoskeletal   Abdominal (+) + obese,   Peds  Hematology   Anesthesia Other Findings Intoxicated-improved from admission where blood alcohol around 300  Reproductive/Obstetrics                          Anesthesia Physical Anesthesia Plan  ASA: II  Anesthesia Plan: General   Post-op Pain Management:    Induction: Intravenous  Airway Management Planned: Oral ETT  Additional Equipment:   Intra-op Plan:   Post-operative Plan: Extubation in OR  Informed Consent: I have reviewed the patients History and Physical, chart, labs and discussed the procedure including the risks, benefits and alternatives for the proposed anesthesia with the patient or authorized representative who has indicated his/her understanding and acceptance.     Plan Discussed with: CRNA and Surgeon  Anesthesia Plan Comments:         Anesthesia Quick Evaluation

## 2013-07-14 NOTE — ED Notes (Signed)
Patient transported to X-ray 

## 2013-07-15 MED ORDER — INFLUENZA VAC SPLIT QUAD 0.5 ML IM SUSP
0.5000 mL | INTRAMUSCULAR | Status: AC
  Administered 2013-07-16: 0.5 mL via INTRAMUSCULAR
  Filled 2013-07-15 (×2): qty 0.5

## 2013-07-15 NOTE — Discharge Summary (Addendum)
Physician Discharge Summary  Patient ID: [name] MRN: [id number] DOB/AGE: Dec 15, 1965 47 y.o.  Admit date: 07/14/2013 Discharge date: 07/16/2013  Admission Diagnoses:  Closed right tibial fracture  Discharge Diagnoses:  Principal Problem:   Closed right tibial fracture Active Problems:   Alcohol intoxication   Past Medical History  Diagnosis Date  . Unspecified essential hypertension   . Colitis   . Gout   . GERD (gastroesophageal reflux disease)   . Arthritis   . Closed right tibial fracture 07/14/2013    Surgeries: Procedure(s): INTRAMEDULLARY (IM) NAIL TIBIAL on 07/14/2013   Consultants (if any): Treatment Team:  Johnny Bridge, MD  Discharged Condition: Improved  Hospital Course: [name] is an 47 y.o. male who was admitted 07/14/2013 with a diagnosis of Closed right tibial fracture and went to the operating room on 07/14/2013 and underwent the above named procedures.    He was given perioperative antibiotics:      Anti-infectives   Start     Dose/Rate Route Frequency Ordered Stop   07/14/13 1600  ceFAZolin (ANCEF) IVPB 2 g/50 mL premix     2 g 100 mL/hr over 30 Minutes Intravenous Every 6 hours 07/14/13 1352 07/15/13 0457   07/14/13 0600  ceFAZolin (ANCEF) IVPB 2 g/50 mL premix     2 g 100 mL/hr over 30 Minutes Intravenous On call to O.R. 07/14/13 1324 07/14/13 0740    .  He was given sequential compression devices, early ambulation, and lovenox in house and aspirin upon discharge for DVT prophylaxis.  He benefited maximally from the hospital stay and there were no complications.    Recent vital signs:  Filed Vitals:   07/16/13 0500  BP: 137/91  Pulse: 72  Temp: 99.8 F (37.7 C)  Resp: 18    Recent laboratory studies:  Lab Results  Component Value Date   HGB 16.2 07/14/2013   HGB 15.1 02/21/2013   HGB 13.9 09/15/2012   Lab Results  Component Value Date   WBC 7.4 07/14/2013   PLT 250 07/14/2013   No results found for this  basename: INR   Lab Results  Component Value Date   NA 137 07/14/2013   K 3.9 07/14/2013   CL 100 07/14/2013   CO2 21 07/14/2013   BUN 11 07/14/2013   CREATININE 0.82 07/14/2013   GLUCOSE 122* 07/14/2013    Discharge Medications:     Medication List         allopurinol 300 MG tablet  Commonly known as:  ZYLOPRIM  Take 300 mg by mouth daily.     amLODipine 5 MG tablet  Commonly known as:  NORVASC  Take 10 mg by mouth daily.     aspirin EC 325 MG tablet  Take 1 tablet (325 mg total) by mouth daily.     atenolol 50 MG tablet  Commonly known as:  TENORMIN  Take 50 mg by mouth 2 (two) times daily.     losartan 100 MG tablet  Commonly known as:  COZAAR  Take 100 mg by mouth daily.     methocarbamol 500 MG tablet  Commonly known as:  ROBAXIN  Take 1 tablet (500 mg total) by mouth 4 (four) times daily.     omeprazole 20 MG capsule  Commonly known as:  PRILOSEC  Take 20 mg by mouth daily.     oxyCODONE-acetaminophen 10-325 MG per tablet  Commonly known as:  PERCOCET  Take 1-2 tablets by mouth every 6 (six) hours as  needed for pain. MAXIMUM TOTAL ACETAMINOPHEN DOSE IS 4000 MG PER DAY     promethazine 25 MG tablet  Commonly known as:  PHENERGAN  Take 1 tablet (25 mg total) by mouth every 6 (six) hours as needed for nausea or vomiting.     sennosides-docusate sodium 8.6-50 MG tablet  Commonly known as:  SENOKOT-S  Take 2 tablets by mouth daily.     sertraline 100 MG tablet  Commonly known as:  ZOLOFT  Take 100 mg by mouth daily.     sulfaSALAzine 500 MG tablet  Commonly known as:  AZULFIDINE  TAKE FOUR TABLETS BY MOUTH TWICE DAILY        Diagnostic Studies: Dg Tibia/fibula Right  07/14/2013   CLINICAL DATA:  Right tibial nail.  EXAM: RIGHT TIBIA AND FIBULA - 2 VIEW  COMPARISON:  07/14/2013  FINDINGS: Six spot fluoroscopic intraoperative views of the right tibia fibula are submitted. These show placement of an intra medullary rod in the tibia. Fracture  fragments of the distal tibial shaft are in near anatomic alignment. Two distal locking screws are present, one of which courses into the distal fibula. Distal fibula diaphysis fracture appears unchanged compared to pre-operative radiographs.  IMPRESSION: 1. Status post ORIF for a comminuted distal tibia diaphysis fracture. No complicating features identified. 2. No significant change in comminuted distal fibula fracture.   Electronically Signed   By: Curlene Dolphin M.D.   On: 07/14/2013 12:42   Dg Tibia/fibula Right  07/14/2013   CLINICAL DATA:  Postreduction right leg fracture.  EXAM: RIGHT TIBIA AND FIBULA - 2 VIEW  COMPARISON:  07/14/2013 at 0136 hr  FINDINGS: Comminuted oblique fractures again demonstrated in the distal right tibia and fibula. There is residual superior and posterior displacement of the distal fracture fragments on the lateral view, similar to previous study. Alignment in the AP direction demonstrates significant improvement.  IMPRESSION: Improved alignment of fractures of the distal right tibia and fibula.   Electronically Signed   By: Lucienne Capers M.D.   On: 07/14/2013 04:04   Dg Tibia/fibula Right  07/14/2013   CLINICAL DATA:  Pain after a fall.  EXAM: RIGHT TIBIA AND FIBULA - 2 VIEW  COMPARISON:  None.  FINDINGS: Comminuted fractures are demonstrated in the distal shafts of the left tibia and fibula. There is lateral displacement and overriding with lateral angulation of the distal fracture fragments. The proximal tibia and fibula appear otherwise intact. Vascular calcifications consistent with phleboliths.  IMPRESSION: Comminuted fractures in the distal left tibial and fibular shafts.   Electronically Signed   By: Lucienne Capers M.D.   On: 07/14/2013 02:04   Dg Ankle Complete Right  07/14/2013   CLINICAL DATA:  Pain in the distal tib-fib after a fall.  EXAM: RIGHT ANKLE - COMPLETE 3+ VIEW  COMPARISON:  None.  FINDINGS: Oblique comminuted fractures are present in the distal  left tibial and fibular shafts. There is overriding, lateral displacement, and lateral angulation of the distal fracture fragments. Associated soft tissue swelling. No underlying bone lesions are appreciated.  IMPRESSION: Comminuted and displaced fractures of the distal left tibial and fibular shafts.   Electronically Signed   By: Lucienne Capers M.D.   On: 07/14/2013 02:03   Dg Tibia/fibula Right Port  07/14/2013   CLINICAL DATA:  In PACU.  Evaluate to be fracture.  Status post ORIF  EXAM: PORTABLE RIGHT TIBIA AND FIBULA - 2 VIEW  COMPARISON:  07/14/2013 tibia and fibula radiographs.  FINDINGS: Splint material  surrounds the lower extremity. There are are postoperative changes of ORIF for a comminuted distal tibia diaphysis fracture. The fracture fragments are in near anatomic alignment. An intramedullary rod with 1 proximal and 2 distal locking screws is present and shows no evidence of complication. No new fracture is identified.  Comminuted distal fibula fracture is in improved alignment compared to the preoperative radiographs. There is approximately 2 mm lateral displacement of a butterfly fragment.  IMPRESSION: 1. Status post ORIF of distal tibia diaphysis fracture. No complicating features. Fractures are in near anatomic alignment. 2. Improved alignment of the comminuted distal fibula fracture.   Electronically Signed   By: Curlene Dolphin M.D.   On: 07/14/2013 13:41    Disposition: 01-Home or Self Care  Discharge Orders   Future Orders Complete By Expires   Call MD / Call 911  As directed    Comments:     If you experience chest pain or shortness of breath, CALL 911 and be transported to the hospital emergency room.  If you develope a fever above 101 F, pus (white drainage) or increased drainage or redness at the wound, or calf pain, call your surgeon's office.   Constipation Prevention  As directed    Comments:     Drink plenty of fluids.  Prune juice may be helpful.  You may use a stool  softener, such as Colace (over the counter) 100 mg twice a day.  Use MiraLax (over the counter) for constipation as needed.   Diet general  As directed    Discharge instructions  As directed    Comments:     Change dressing in 3 days and reapply fresh dressing, unless you have a splint (half cast).  If you have a splint/cast, just leave in place until your follow-up appointment.    Keep wounds dry for 3 weeks.  Leave steri-strips in place on skin.  Do not apply lotion or anything to the wound.   Touch down weight bearing  As directed    Questions:     Laterality:     Extremity:        Follow-up Information   Follow up with Johnny Bridge, MD. Schedule an appointment as soon as possible for a visit in 2 weeks.   Specialty:  Orthopedic Surgery   Contact information:   [place] [id number] [phone]        Signed: Johnny Bridge 07/16/2013, 8:46 AM

## 2013-07-15 NOTE — Evaluation (Signed)
Physical Therapy Evaluation Patient Details Name: [name] MRN: [id number] DOB: 1965-08-31 Today's Date: 07/15/2013 Time: 1194-1740 PT Time Calculation (min): 18 min  PT Assessment / Plan / Recommendation History of Present Illness  [name] is a 47 y.o. male who was apparent for bed tonight, walking around the bed, twisted his right ankle/leg, felt a snap, and came into the emergency room by EMS. He had the acute severe pain, difficulty ambulating, no loss of consciousness, no other injuries. He recently had a bone density test, which she thinks is okay. He has broken his left ankle before, and his wife indicates that when he injured the right side tonight, the leg was just "dangling". Now s/p INTRAMEDULLARY (IM) NAIL TIBIAL  Clinical Impression  Patient is s/p above surgery resulting in functional limitations due to the deficits listed below (see PT Problem List).  Patient will benefit from skilled PT to increase their independence and safety with mobility to allow discharge to the venue listed below.       PT Assessment  Patient needs continued PT services    Follow Up Recommendations  Outpatient PT;Supervision - Intermittent Potential need for Outpatient PT can be addressed at Ortho follow-up    Does the patient have the potential to tolerate intense rehabilitation      Barriers to Discharge        Equipment Recommendations  Crutches;3in1 (PT)  Pt has knee scooter -- will need to find out from Ortho if he can safely WB through R knee   Recommendations for Other Services     Frequency Min 6X/week    Precautions / Restrictions Restrictions Weight Bearing Restrictions: Yes RLE Weight Bearing: Non weight bearing   Pertinent Vitals/Pain 6/10 Pain RLE patient repositioned for comfort elevated for edema and pain control       Mobility  Bed Mobility Bed Mobility: Supine to Sit;Sitting - Scoot to Edge of Bed Supine to Sit: 4: Min assist Sitting - Scoot to  [employer] of Bed: 4: Min assist Details for Bed Mobility Assistance: Min assist for RLE Transfers Transfers: Sit to Stand;Stand to Sit Sit to Stand: 4: Min guard Stand to Sit: 4: Min guard Details for Transfer Assistance: cues for hand placement and NWB Ambulation/Gait Ambulation/Gait Assistance: 4: Min guard Ambulation Distance (Feet): 8 Feet Assistive device: Rolling walker Ambulation/Gait Assistance Details: Cues for technique; Good pressing of body weight into RW fro smoother stepping as opposed t hopping Gait Pattern: Step-to pattern    Exercises     PT Diagnosis: Difficulty walking;Acute pain  PT Problem List: Decreased strength;Decreased range of motion;Decreased activity tolerance;Decreased mobility;Decreased knowledge of use of DME;Decreased knowledge of precautions;Pain PT Treatment Interventions: DME instruction;Gait training;Stair training;Functional mobility training;Therapeutic activities;Therapeutic exercise;Balance training;Patient/family education     PT Goals(Current goals can be found in the care plan section) Acute Rehab PT Goals Patient Stated Goal: home PT Goal Formulation: With patient Time For Goal Achievement: 07/20/13 Potential to Achieve Goals: Good  Visit Information  Last PT Received On: 07/15/13 Assistance Needed: +1 History of Present Illness: [name] is a 47 y.o. male who was apparent for bed tonight, walking around the bed, twisted his right ankle/leg, felt a snap, and came into the emergency room by EMS. He had the acute severe pain, difficulty ambulating, no loss of consciousness, no other injuries. He recently had a bone density test, which she thinks is okay. He has broken his left ankle before, and his wife indicates that when he injured the  right side tonight, the leg was just "dangling". Now s/p INTRAMEDULLARY (IM) NAIL TIBIAL       Prior [place] expects to be discharged to:: Private residence Living  Arrangements: Spouse/significant other Available Help at Discharge: Family;Available PRN/intermittently Type of Home: House Home Access: Stairs to enter [employer] of Steps: 5 Home Layout: One level Home Equipment: Crutches Prior Function Level of Independence: Independent Communication Communication: No difficulties    Cognition  Cognition Arousal/Alertness: Awake/alert Behavior During Therapy: WFL for tasks assessed/performed Overall Cognitive Status: Within Functional Limits for tasks assessed    Extremity/Trunk Assessment Upper Extremity Assessment Upper Extremity Assessment: Overall WFL for tasks assessed Lower Extremity Assessment Lower Extremity Assessment: RLE deficits/detail RLE Deficits / Details: Grossly decr AROM and strength, limited by pain postop; positive toe wiggle   Balance    End of Session PT - End of Session Activity Tolerance: Patient tolerated treatment well;Patient limited by pain Patient left: in chair;with call bell/phone within reach Nurse Communication: Mobility status  GP     [place], [place], [place]  07/15/2013, 12:26 PM

## 2013-07-15 NOTE — Progress Notes (Signed)
Patient ID: [name], male   DOB: 1965-08-22, 47 y.o.   MRN: [id number]     Subjective:  Patient reports pain as mild to moderate.  Patient states that his pain was much worse last night but is better today.  Patient sitting up in the chair and in no distress.  Objective:   VITALS:   Filed Vitals:   07/14/13 1341 07/14/13 1523 07/14/13 2100 07/15/13 0606  BP: 151/84 134/71 139/86 145/88  Pulse: 83 88 98 77  Temp: 99 F (37.2 C) 98.6 F (37 C) 98.9 F (37.2 C) 98.5 F (36.9 C)  TempSrc:  Oral    Resp: 12 18 17 18   Height:      Weight:      SpO2: 94% 95% 94% 98%    ABD soft Sensation intact distally Dorsiflexion/Plantar flexion intact Incision: dressing C/D/I and no drainage Patient has 0-90 degrees of flexion at the knee with some pain Sensation intact to all of his toes and able to flex and extend all toes Splint intact and should be in place at all times.   Lab Results  Component Value Date   WBC 7.4 07/14/2013   HGB 16.2 07/14/2013   HCT 46.8 07/14/2013   MCV 96.5 07/14/2013   PLT 250 07/14/2013     Assessment/Plan: 1 Day Post-Op   Principal Problem:   Closed right tibial fracture Active Problems:   Alcohol intoxication   Advance diet Up with therapy Plan for discharge tomorrow patient may go home today if able to pass PT  Patient would like to use crutches instead of walker. NWB right lower ext.    [name] 07/15/2013, 9:55 AM  Discussed with [name], and agree with above.  Robert Bond, MD Cell [phone]

## 2013-07-15 NOTE — Progress Notes (Signed)
Utilization Review Completed.Donne Anon T11/30/2014

## 2013-07-15 NOTE — Progress Notes (Addendum)
Patients foley catheter was removed on 07/14/2013 at 12:15 pm. During change of shift report notification was made that the patient was due to void. Patient was bladder scanned by the tech and got results of 126 cc's.

## 2013-07-15 NOTE — Consult Note (Signed)
Patient Declined DME ordered.

## 2013-07-15 NOTE — Progress Notes (Signed)
   CARE MANAGEMENT NOTE 07/15/2013  Patient:  CATCHER, DEHOYOS   Account Number:  [id number]  Date Initiated:  07/15/2013  Documentation initiated by:  [facility]  Subjective/Objective Assessment:   adm: right tibial fracture     Action/Plan:   discharge planning   Anticipated DC Date:  07/15/2013   Anticipated DC Plan:  [place]  CM consult      Choice offered to / List presented to:     DME arranged  3-N-1  Vassie Moselle      DME agency  [place].        Status of service:  Completed, signed off Medicare Important Message given?   (If response is "NO", the following Medicare IM given date fields will be blank) Date Medicare IM given:   Date Additional Medicare IM given:    Discharge Disposition:  HOME/SELF CARE  Per UR Regulation:    If discussed at Long Length of Stay Meetings, dates discussed:    Comments:  07/15/13 12:05 CM spoke to [facility] to ensure 3n1 and rooling walker would be delivered to room prior to discharge.  No other CM needs were communicated.  Mariane Masters, BSN, CM [phone].

## 2013-07-15 NOTE — Progress Notes (Signed)
Physical Therapy Treatment Patient Details Name: [name] MRN: [id number] DOB: 02/09/1966 Today's Date: 07/15/2013 Time: 9798-9211 PT Time Calculation (min): 17 min  PT Assessment / Plan / Recommendation  History of Present Illness [name] is a 47 y.o. male who was apparent for bed tonight, walking around the bed, twisted his right ankle/leg, felt a snap, and came into the emergency room by EMS. He had the acute severe pain, difficulty ambulating, no loss of consciousness, no other injuries. He recently had a bone density test, which she thinks is okay. He has broken his left ankle before, and his wife indicates that when he injured the right side tonight, the leg was just "dangling". Now s/p INTRAMEDULLARY (IM) NAIL TIBIAL   PT Comments   Good progress and using crutches well; OK for dc home from PT standpoint  Follow Up Recommendations  Outpatient PT;Supervision - Intermittent     Does the patient have the potential to tolerate intense rehabilitation     Barriers to Discharge        Equipment Recommendations  None recommended by PT    Recommendations for Other Services    Frequency Min 6X/week   Progress towards PT Goals Progress towards PT goals: Progressing toward goals  Plan Current plan remains appropriate    Precautions / Restrictions Precautions Precautions: None Restrictions RLE Weight Bearing: Non weight bearing   Pertinent Vitals/Pain 5/10 RLE; elevated for edema and pain control     Mobility  Bed Mobility Bed Mobility: Supine to Sit;Sitting - Scoot to Edge of Bed;Sit to Supine Supine to Sit: 5: Supervision Sitting - Scoot to Edge of Bed: 5: Supervision Sit to Supine: 4: Min guard Details for Bed Mobility Assistance: Uses bil UEs to help RLE off of and onto bed Transfers Transfers: Sit to Stand;Stand to Sit Sit to Stand: 5: Supervision Stand to Sit: 5: Supervision Details for Transfer Assistance: Supervision for safety standing to  crutches Ambulation/Gait Ambulation/Gait Assistance: 4: Min guard;5: Supervision Ambulation Distance (Feet): 220 Feet Assistive device: Crutches Ambulation/Gait Assistance Details: minguard assist progressing to Supervision; Cues for control; Pt is clearly well-versed in using crutches Gait Pattern: Step-through pattern Stairs: Yes Stairs Assistance: 4: Min guard Stair Management Technique: No rails;With crutches;One rail Left;Step to pattern Number of Stairs: 2 (2 crutches ascending; crutch and rail descending)    Exercises     PT Diagnosis:    PT Problem List:   PT Treatment Interventions:     PT Goals (current goals can now be found in the care plan section) Acute Rehab PT Goals Patient Stated Goal: home PT Goal Formulation: With patient Time For Goal Achievement: 07/20/13 Potential to Achieve Goals: Good  Visit Information  Last PT Received On: 07/15/13 Assistance Needed: +1 History of Present Illness: [name] is a 47 y.o. male who was apparent for bed tonight, walking around the bed, twisted his right ankle/leg, felt a snap, and came into the emergency room by EMS. He had the acute severe pain, difficulty ambulating, no loss of consciousness, no other injuries. He recently had a bone density test, which she thinks is okay. He has broken his left ankle before, and his wife indicates that when he injured the right side tonight, the leg was just "dangling". Now s/p INTRAMEDULLARY (IM) NAIL TIBIAL    Subjective Data  Subjective: wanting to use crutches Patient Stated Goal: home   Cognition  Cognition Arousal/Alertness: Awake/alert Behavior During Therapy: WFL for tasks assessed/performed Overall Cognitive Status: Within Functional  Limits for tasks assessed    Balance     End of Session PT - End of Session Activity Tolerance: Patient tolerated treatment well Patient left: in bed;with call bell/phone within reach Nurse Communication: Mobility status   GP      Roney Marion [facility], [place]  07/15/2013, 5:03 PM

## 2013-07-15 NOTE — Progress Notes (Signed)
Patient has voided 150 cc's.

## 2013-07-16 ENCOUNTER — Other Ambulatory Visit: Payer: Self-pay | Admitting: Gastroenterology

## 2013-07-16 ENCOUNTER — Encounter (HOSPITAL_COMMUNITY): Payer: Self-pay | Admitting: General Practice

## 2013-07-16 NOTE — Progress Notes (Signed)
OT Cancellation Note and Discharge  Patient Details Name: [name] MRN: [id number] DOB: 15-Oct-1965   Cancelled Treatment:    Reason Eval/Treat Not Completed: OT screened, no needs identified, will sign off. Pt reports that he has had his other leg broken and has all needed DME and A at home, does not foresee any issues with BADLs.   Almon Register [phone] 07/16/2013, 9:59 AM

## 2013-07-16 NOTE — Care Management Note (Signed)
No home health needs identified. Ricki Miller, RN BSN

## 2013-07-16 NOTE — Progress Notes (Signed)
Patient d/c to home with wife.  IV removed.  Prescriptions given and d/c instructions reviewed.

## 2013-07-16 NOTE — Progress Notes (Signed)
PT Cancellation Note  Patient Details Name: [name] MRN: [id number] DOB: [birth year]   Cancelled Treatment:    Reason Eval/Treat Not Completed: Other (comment)  Politely declining further amb and stair training;  Did quite well with crutches yesterday -- do not foresee any problems with going home;  OK for dc home from PT standpoint;  Thanks,  [place], [place]'     Roney Marion [facility] 07/16/2013, 10:27 AM

## 2013-07-16 NOTE — Progress Notes (Signed)
Patient ID: REAL CONA, male   DOB: [birth year], 47 y.o.   MRN: [id number]     Subjective:  Patient reports pain as mild.  Patient resting but woke up alert and in no distress.  Objective:   VITALS:   Filed Vitals:   07/15/13 0606 07/15/13 1502 07/15/13 2115 07/16/13 0500  BP: 145/88 138/80 143/89 137/91  Pulse: 77 71 92 72  Temp: 98.5 F (36.9 C) 99.3 F (37.4 C) 99.9 F (37.7 C) 99.8 F (37.7 C)  TempSrc:  Oral Oral Oral  Resp: 18 18 19 18   Height:      Weight:      SpO2: 98% 95% 92% 96%    ABD soft Sensation intact distally Incision: dressing C/D/I and no drainage Patient can flex and extend all toes He has 0-90 degrees of flex at the knee Splint in place and functioning    Lab Results  Component Value Date   WBC 7.4 07/14/2013   HGB 16.2 07/14/2013   HCT 46.8 07/14/2013   MCV 96.5 07/14/2013   PLT 250 07/14/2013     Assessment/Plan: 2 Days Post-Op   Principal Problem:   Closed right tibial fracture Active Problems:   Alcohol intoxication   Advance diet Up with therapy Discharge home with home health NWB right lower ext Splint at all times Follow up with Dr Mardelle Matte in two weeks   [name] 07/16/2013, 7:22 AM  Discussed with Joya Gaskins and agree with above.  Marchia Bond, MD Cell [phone]

## 2013-07-17 ENCOUNTER — Encounter (HOSPITAL_COMMUNITY): Payer: Self-pay | Admitting: Orthopedic Surgery

## 2013-07-27 ENCOUNTER — Telehealth: Payer: Self-pay | Admitting: Gastroenterology

## 2013-07-27 MED ORDER — BUDESONIDE 9 MG PO TB24: 9.0000 mg | ORAL_TABLET | Freq: Every day | ORAL | Status: DC

## 2013-07-27 NOTE — Telephone Encounter (Signed)
Begin uceris 6m qd C/b in 5 days if no better. OV 3-4 weeks

## 2013-07-27 NOTE — Telephone Encounter (Signed)
Spoke with pt and he is aware. Prescription called to pharmacy. Pt to call back in 5 days with report, will schedule OV at that time.

## 2013-07-27 NOTE — Telephone Encounter (Signed)
Pt states that he has been having rectal bleeding for 2 weeks. Has h/o UC and states he has been given steroids before. Offered pt an appt with [facility] but he states he cannot afford his copay to come in. Pt would like for [place] to call in some mediation. Please advise.

## 2013-08-02 ENCOUNTER — Telehealth: Payer: Self-pay | Admitting: Gastroenterology

## 2013-08-02 NOTE — Telephone Encounter (Signed)
Pt called to let us know he is feeling better and his bowels are just about back to normal since taking the medication. OV scheduled with pt for follow-up appt.

## 2013-09-03 ENCOUNTER — Encounter: Payer: Self-pay | Admitting: Gastroenterology

## 2013-09-03 ENCOUNTER — Ambulatory Visit (INDEPENDENT_AMBULATORY_CARE_PROVIDER_SITE_OTHER): Payer: BC Managed Care – PPO | Admitting: Gastroenterology

## 2013-09-03 VITALS — BP 136/88 | HR 56 | Ht 70.0 in | Wt 231.2 lb

## 2013-09-03 DIAGNOSIS — K5150 Left sided colitis without complications: Secondary | ICD-10-CM

## 2013-09-03 DIAGNOSIS — M899 Disorder of bone, unspecified: Secondary | ICD-10-CM

## 2013-09-03 DIAGNOSIS — M949 Disorder of cartilage, unspecified: Secondary | ICD-10-CM

## 2013-09-03 DIAGNOSIS — M8580 Other specified disorders of bone density and structure, unspecified site: Secondary | ICD-10-CM

## 2013-09-03 NOTE — Progress Notes (Signed)
          History of Present Illness:  The patient has returned for followup of left-sided colitis.  Symptoms have subsided since starting Uceris one month ago.  He continues on Azulfidine.  He had several questions about bone density report since he's had 2 fractures in the last 2 years.    Review of Systems: Pertinent positive and negative review of systems were noted in the above HPI section. All other review of systems were otherwise negative.    Current Medications, Allergies, Past Medical History, Past Surgical History, Family History and Social History were reviewed in [facility] record  Vital signs were reviewed in today's medical record. Physical Exam: General: Well developed , well nourished, no acute distress  See Assessment and Plan under Problem List

## 2013-09-03 NOTE — Assessment & Plan Note (Signed)
He is had a recent flare that has responded to be a Uceris.  Plan on an 8 week course of full dose of Uceris and then try stopping.  If necessary, we can try a maintenance dose of 4 mg daily for one month.

## 2013-09-03 NOTE — Assessment & Plan Note (Signed)
Plan to refer to Dr. Shelia Media regarding bone density findings

## 2013-09-03 NOTE — Patient Instructions (Signed)
Follow up in 3 months  Continue Uceris for one month the discontinue

## 2013-10-17 ENCOUNTER — Other Ambulatory Visit: Payer: Self-pay | Admitting: Gastroenterology

## 2013-10-28 ENCOUNTER — Other Ambulatory Visit: Payer: Self-pay | Admitting: Gastroenterology

## 2014-01-12 ENCOUNTER — Other Ambulatory Visit: Payer: Self-pay | Admitting: Gastroenterology

## 2014-03-20 ENCOUNTER — Other Ambulatory Visit: Payer: Self-pay | Admitting: Gastroenterology

## 2014-04-30 IMAGING — CR DG TIBIA/FIBULA 2V*R*
3 series · 3 of 3 positions shown · non-contrast
Comparison: 07/14/2013 at 6935 hr

CLINICAL DATA: Postreduction right leg fracture.

EXAM:
RIGHT TIBIA AND FIBULA - 2 VIEW

[x tib-fib lat right (1 of 2)]
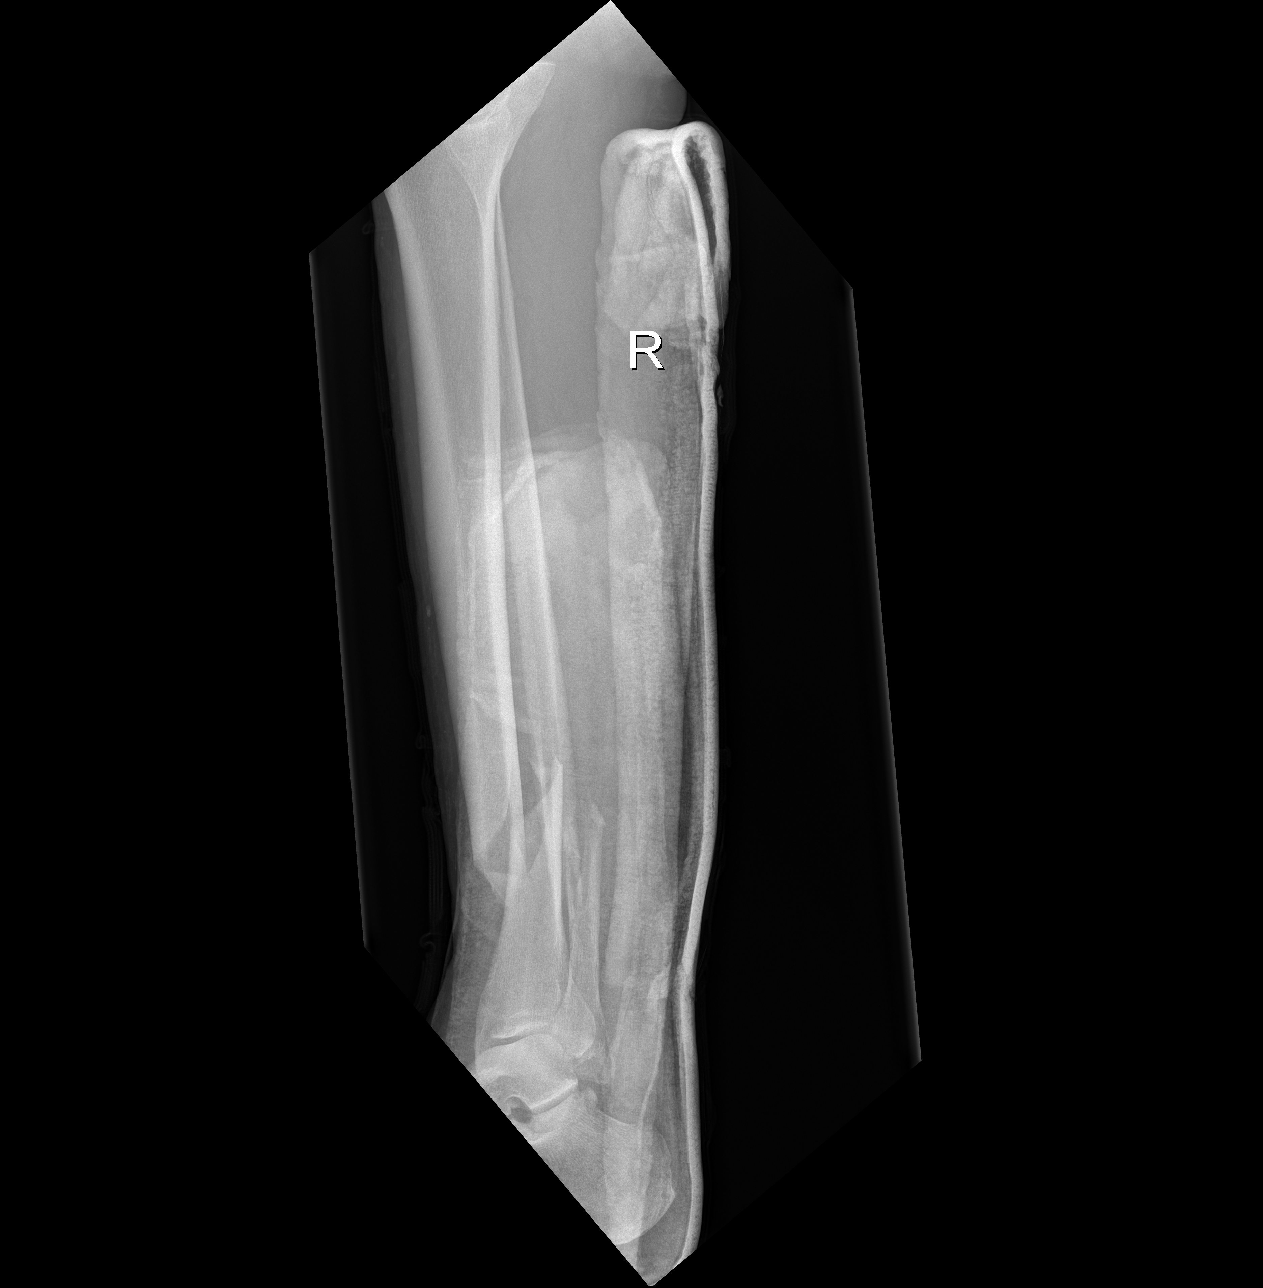

[x tib-fib lat right (2 of 2)]
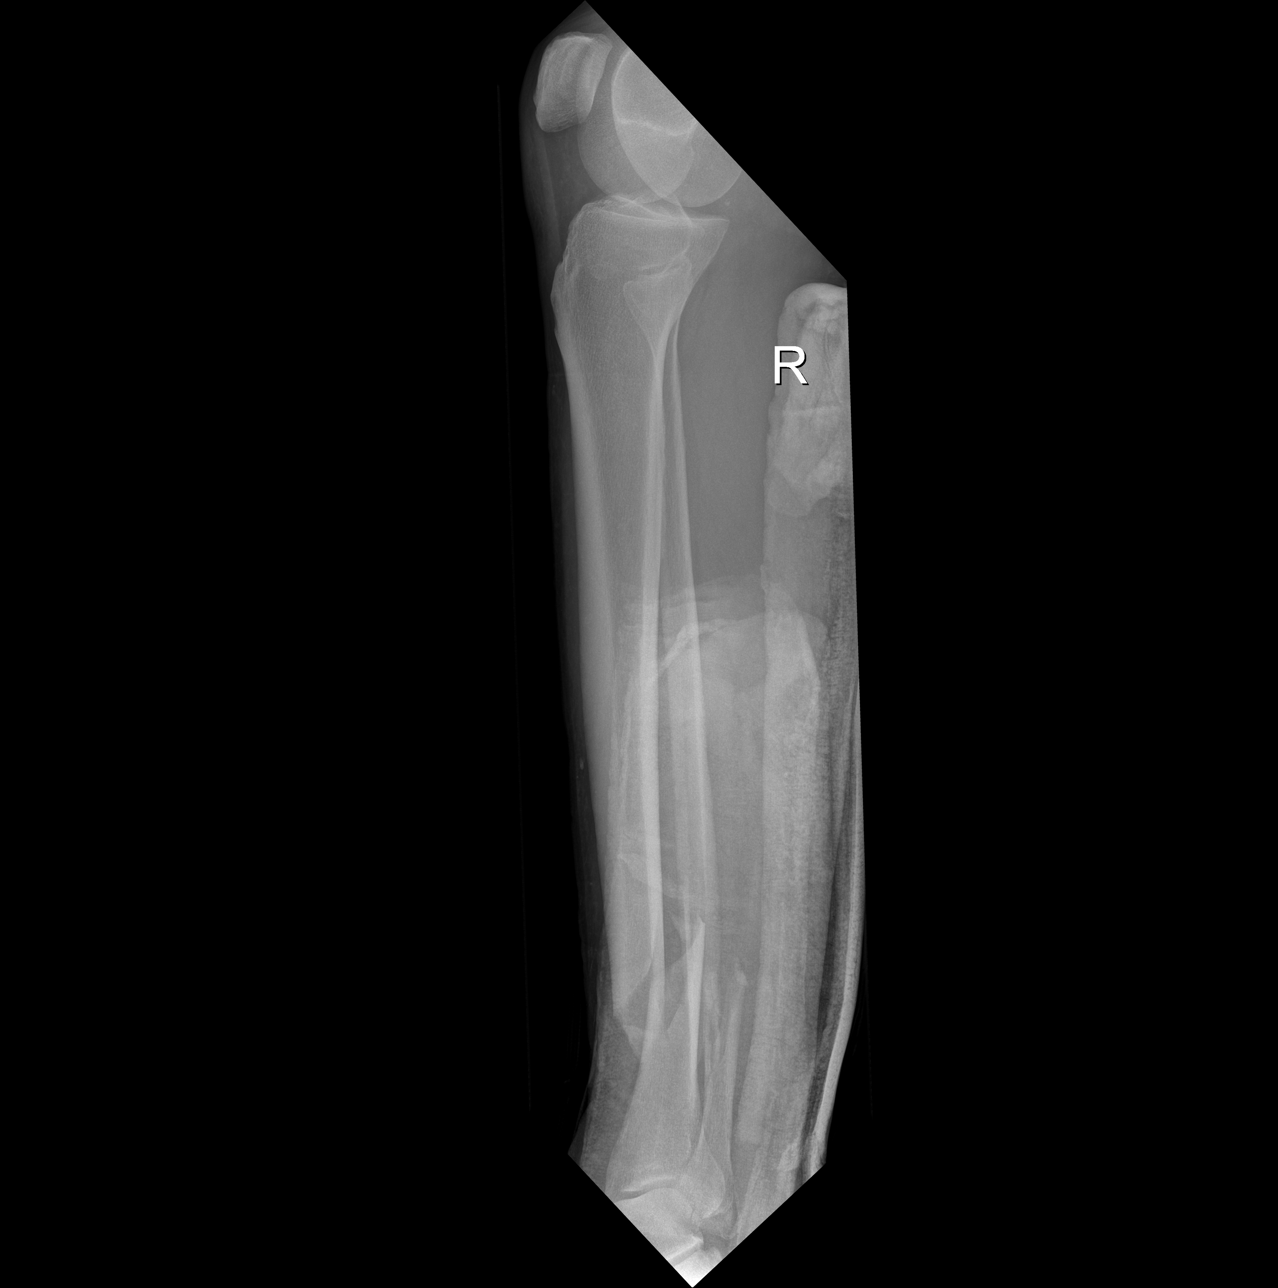

[x tib-fib ap right]
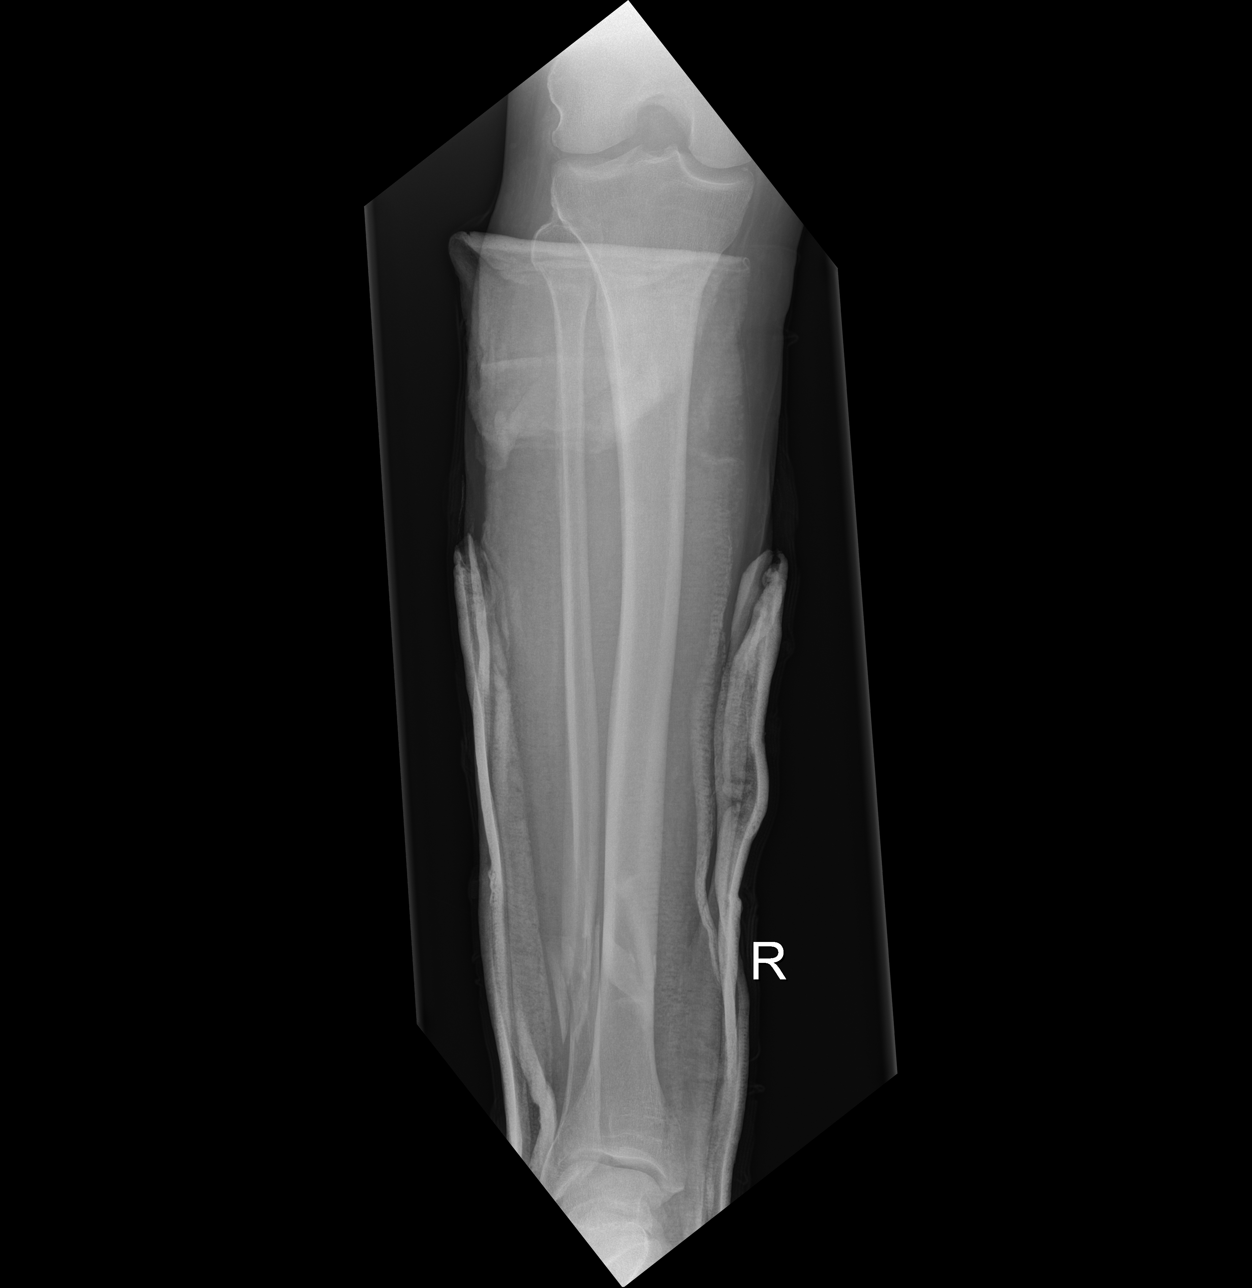

[3 of 3 positions shown; findings below may reference images not displayed]

FINDINGS: Comminuted oblique fractures again demonstrated in the distal right
tibia and fibula. There is residual superior and posterior
displacement of the distal fracture fragments on the lateral view,
similar to previous study. Alignment in the AP direction
demonstrates significant improvement.
IMPRESSION: Improved alignment of fractures of the distal right tibia and
fibula.

## 2014-05-31 ENCOUNTER — Other Ambulatory Visit: Payer: Self-pay

## 2014-07-31 ENCOUNTER — Other Ambulatory Visit: Payer: Self-pay | Admitting: Gastroenterology

## 2015-01-20 ENCOUNTER — Other Ambulatory Visit: Payer: Self-pay | Admitting: Gastroenterology

## 2015-07-16 ENCOUNTER — Other Ambulatory Visit: Payer: Self-pay | Admitting: Registered Nurse

## 2016-04-02 ENCOUNTER — Telehealth: Payer: Self-pay | Admitting: *Deleted

## 2016-04-02 MED ORDER — SULFASALAZINE 500 MG PO TABS: 2000.0000 mg | ORAL_TABLET | Freq: Two times a day (BID) | ORAL | 0 refills | Status: DC

## 2016-04-02 NOTE — Telephone Encounter (Signed)
Please advise patient to schedule follow up appointment and send enough refills until appt. Thanks

## 2016-04-02 NOTE — Telephone Encounter (Signed)
Dr Silverio Decamp, This patient needs a refill on sulfasalazine, has not been seen since 2015 by Dr Deatra Ina.  He needs an office appointment but has not currently scheduled one.  What should we do about his refills?  You are Doc of the Day this afternoon.

## 2016-04-02 NOTE — Telephone Encounter (Signed)
Left message for patient to contact the office and schedule an appointment refill sent

## 2016-06-21 ENCOUNTER — Ambulatory Visit (INDEPENDENT_AMBULATORY_CARE_PROVIDER_SITE_OTHER): Payer: 59 | Admitting: Gastroenterology

## 2016-06-21 ENCOUNTER — Encounter: Payer: Self-pay | Admitting: Gastroenterology

## 2016-06-21 VITALS — BP 152/110 | HR 80 | Ht 70.5 in | Wt 228.0 lb

## 2016-06-21 DIAGNOSIS — Z1211 Encounter for screening for malignant neoplasm of colon: Secondary | ICD-10-CM

## 2016-06-21 DIAGNOSIS — K5120 Ulcerative (chronic) proctitis without complications: Secondary | ICD-10-CM | POA: Diagnosis not present

## 2016-06-21 MED ORDER — NA SULFATE-K SULFATE-MG SULF 17.5-3.13-1.6 GM/177ML PO SOLN: 1.0000 | Freq: Once | ORAL | 0 refills | Status: AC

## 2016-06-21 MED ORDER — MESALAMINE 1.2 G PO TBEC: 2.4000 g | DELAYED_RELEASE_TABLET | Freq: Every day | ORAL | 3 refills | Status: DC

## 2016-06-21 NOTE — Patient Instructions (Addendum)
You have been scheduled for a colonoscopy. Please follow written instructions given to you at your visit today.  Please pick up your prep supplies at the pharmacy within the next 1-3 days. If you use inhalers (even only as needed), please bring them with you on the day of your procedure. Your physician has requested that you go to www.startemmi.com and enter the access code given to you at your visit today. This web site gives a general overview about your procedure. However, you should still follow specific instructions given to you by our office regarding your preparation for the procedure.  We have sent Lialda to your pharmacy

## 2016-06-21 NOTE — Progress Notes (Signed)
[name]    [id number]    1965/09/30  Primary Care Physician:PHARR,WALTER DAVIDSON, MD  Referring Physician: Deland Pretty, MD [name], [place] [id number]  Chief complaint:  Left sided ulcerative colitis  HPI: 76 yr M with h/o UC previously followed by Dr. Deatra Ina is here for follow-up visit. Patient reports he hasn't had any recurrent flares since 2011. Currently on sulfasalazine 1065m daily. Denies any nausea, vomiting, abdominal pain, melena or bright red blood per rectum Per patient he had labs through his PMDs office in June 2017, results not available to review during this visit. He had flexible sigmoidoscopy in April 2011 that showed active colitis in the left colon.      Outpatient Encounter Prescriptions as of 06/21/2016  Medication Sig  . allopurinol (ZYLOPRIM) 300 MG tablet Take 300 mg by mouth daily.    .Marland KitchenamLODipine (NORVASC) 5 MG tablet Take 10 mg by mouth daily.   .[place] Kitchenaspirin EC 325 MG tablet Take 1 tablet (325 mg total) by mouth daily.  .[place] Kitchenatenolol (TENORMIN) 50 MG tablet Take 50 mg by mouth 2 (two) times daily.   .Marland Kitchenlosartan (COZAAR) 100 MG tablet Take 100 mg by mouth daily.    .[place] Kitchenomeprazole (PRILOSEC) 20 MG capsule Take 20 mg by mouth daily.    . sertraline (ZOLOFT) 100 MG tablet Take 100 mg by mouth daily.   .Mar[place] KitchensulfaSALAzine (AZULFIDINE) 500 MG tablet Take 4 tablets (2,000 mg total) by mouth 2 (two) times daily.  .Marland KitchenUCERIS 9 MG TB24 TAKE 1 TABLET BY MOUTH EVERY DAY   No facility-administered encounter medications on file as of 06/21/2016.     Allergies as of 06/21/2016  . (No Known Allergies)    Past Medical History:  Diagnosis Date  . Arthritis   . Closed right tibial fracture 07/14/2013  . Colitis   . GERD (gastroesophageal reflux disease)   . Gout   . Unspecified essential hypertension     Past Surgical History:  Procedure Laterality Date  . HAND SURGERY    . IM NAILING TIBIA Right 07/14/2013   Dr LMardelle Matte   . KNEE ARTHROSCOPY    . ORIF ANKLE FRACTURE  07/10/2012   Procedure: OPEN REDUCTION INTERNAL FIXATION (ORIF) ANKLE FRACTURE;  Surgeon: MNewt Minion MD;  [place] [facility]  Service: Orthopedics;  Laterality: Left;  Open Reduction Internal Fixation Left Fibula  . TIBIA IM NAIL INSERTION Right 07/14/2013   Procedure: INTRAMEDULLARY (IM) NAIL TIBIAL;  Surgeon: JJohnny Bridge MD;  [facility]  Service: Orthopedics;  Laterality: Right;  . WRIST GANGLION EXCISION      Family History  Problem Relation Age of Onset  . Adopted: Yes    Social History   Social History  . Marital status: Married    Spouse name: N/A  . Number of children: 3  . Years of education: N/A   Occupational History  . SUPERVISOR [employer]   Social History Main Topics  . Smoking status: Former Smoker    Quit date: 08/16/2005  . Smokeless tobacco: Never Used  . Alcohol use 21.6 oz/week    36 Cans of beer per week  . Drug use: No  . Sexual activity: Not on file   Other Topics Concern  . Not on file   Social History Narrative  . No narrative on file      Review of systems: Review of Systems  Constitutional:  Negative for fever and chills.  HENT: Negative.   Eyes: Negative for blurred vision.  Respiratory: Negative for cough, shortness of breath and wheezing.   Cardiovascular: Negative for chest pain and palpitations.  Gastrointestinal: as per HPI Genitourinary: Negative for dysuria, urgency, frequency and hematuria.  Musculoskeletal: Negative for myalgias, back pain and joint pain.  Skin: Negative for itching and rash.  Neurological: Negative for dizziness, tremors, focal weakness, seizures and loss of consciousness.  Endo/Heme/Allergies: Positive for seasonal allergies.  Psychiatric/Behavioral: Negative for depression, suicidal ideas and hallucinations.  All other systems reviewed and are negative.   Physical Exam: There were no vitals filed for this visit. There is no height or weight  on file to calculate BMI. Gen:      No acute distress HEENT:  EOMI, sclera anicteric Neck:     No masses; no thyromegaly Lungs:    Clear to auscultation bilaterally; normal respiratory effort CV:         Regular rate and rhythm; no murmurs Abd:      + bowel sounds; soft, non-tender; no palpable masses, no distension Ext:    No edema; adequate peripheral perfusion Skin:      Warm and dry; no rash Neuro: alert and oriented x 3 Psych: normal mood and affect  Data Reviewed:  Reviewed labs, radiology imaging, old records and pertinent past GI work up  Flexible sigmoidoscopy:11/2009 Chronic active colitis  Assessment and Plan/Recommendations:  50 year old male with history of ulcerative colitis, noted to have active colitis in the left colon on flexible sigmoidoscopy in 2011, appears to be currently in remission on sulfasalazine No record of colonoscopy in epic but patient does think he had EGD and colonoscopy prior to the flexible sigmoidoscopy in 2011 He is due for screening colonoscopy, we'll schedule it today The risks and benefits as well as alternatives of endoscopic procedure(s) have been discussed and reviewed. All questions answered. The patient agrees to proceed. Continue mesalamine We'll obtain records of labs from [place] office, if unavailable We'll check CBC, CMP, CRP ferritin, B12 and folate level  GERD: Well controlled on PPI daily Follow antireflux measures  Return in 6-12 months or sooner if needed 25 minutes was spent face-to-face with the patient. Greater than 50% of the time used for counseling as well as treatment plan and follow-up. She had multiple questions which were answered to her satisfaction  K. Denzil Magnuson , MD [phone] Mon-Fri 8a-5p [phone] after 5p, weekends, holidays  CC: Deland Pretty, MD

## 2016-08-17 ENCOUNTER — Encounter: Payer: Self-pay | Admitting: Gastroenterology

## 2016-08-27 ENCOUNTER — Ambulatory Visit (AMBULATORY_SURGERY_CENTER): Payer: 59 | Admitting: Gastroenterology

## 2016-08-27 ENCOUNTER — Other Ambulatory Visit (INDEPENDENT_AMBULATORY_CARE_PROVIDER_SITE_OTHER): Payer: 59

## 2016-08-27 ENCOUNTER — Encounter: Payer: Self-pay | Admitting: Gastroenterology

## 2016-08-27 ENCOUNTER — Other Ambulatory Visit: Payer: Self-pay

## 2016-08-27 VITALS — BP 130/78 | HR 66 | Temp 98.6°F | Resp 13 | Ht 70.5 in | Wt 228.0 lb

## 2016-08-27 DIAGNOSIS — D125 Benign neoplasm of sigmoid colon: Secondary | ICD-10-CM

## 2016-08-27 DIAGNOSIS — K529 Noninfective gastroenteritis and colitis, unspecified: Secondary | ICD-10-CM | POA: Diagnosis not present

## 2016-08-27 DIAGNOSIS — K635 Polyp of colon: Secondary | ICD-10-CM

## 2016-08-27 DIAGNOSIS — D122 Benign neoplasm of ascending colon: Secondary | ICD-10-CM

## 2016-08-27 DIAGNOSIS — K5120 Ulcerative (chronic) proctitis without complications: Secondary | ICD-10-CM | POA: Diagnosis not present

## 2016-08-27 LAB — CBC WITH DIFFERENTIAL/PLATELET
BASOS PCT: 0.9 % (ref 0.0–3.0)
Basophils Absolute: 0.1 10*3/uL (ref 0.0–0.1)
EOS ABS: 0.1 10*3/uL (ref 0.0–0.7)
EOS PCT: 1.1 % (ref 0.0–5.0)
HEMATOCRIT: 44.7 % (ref 39.0–52.0)
HEMOGLOBIN: 15.4 g/dL (ref 13.0–17.0)
LYMPHS PCT: 20.5 % (ref 12.0–46.0)
Lymphs Abs: 1.7 10*3/uL (ref 0.7–4.0)
MCHC: 34.4 g/dL (ref 30.0–36.0)
MCV: 91.9 fl (ref 78.0–100.0)
Monocytes Absolute: 0.7 10*3/uL (ref 0.1–1.0)
Monocytes Relative: 7.8 % (ref 3.0–12.0)
NEUTROS ABS: 5.9 10*3/uL (ref 1.4–7.7)
Neutrophils Relative %: 69.7 % (ref 43.0–77.0)
PLATELETS: 297 10*3/uL (ref 150.0–400.0)
RBC: 4.86 Mil/uL (ref 4.22–5.81)
RDW: 12.8 % (ref 11.5–15.5)
WBC: 8.5 10*3/uL (ref 4.0–10.5)

## 2016-08-27 LAB — C-REACTIVE PROTEIN: CRP: 0.2 mg/dL — ABNORMAL LOW (ref 0.5–20.0)

## 2016-08-27 LAB — COMPREHENSIVE METABOLIC PANEL
ALBUMIN: 4.1 g/dL (ref 3.5–5.2)
ALT: 23 U/L (ref 0–53)
AST: 17 U/L (ref 0–37)
Alkaline Phosphatase: 12 U/L — ABNORMAL LOW (ref 39–117)
BUN: 10 mg/dL (ref 6–23)
CALCIUM: 8.9 mg/dL (ref 8.4–10.5)
CHLORIDE: 104 meq/L (ref 96–112)
CO2: 29 meq/L (ref 19–32)
Creatinine, Ser: 0.98 mg/dL (ref 0.40–1.50)
GFR: 85.52 mL/min (ref >60.00)
Glucose, Bld: 103 mg/dL — ABNORMAL HIGH (ref 70–99)
POTASSIUM: 3.8 meq/L (ref 3.5–5.1)
Sodium: 140 mEq/L (ref 135–145)
Total Bilirubin: 0.5 mg/dL (ref 0.2–1.2)
Total Protein: 7 g/dL (ref 6.0–8.3)

## 2016-08-27 LAB — FERRITIN: Ferritin: 161.9 ng/mL (ref 22.0–322.0)

## 2016-08-27 LAB — VITAMIN B12: VITAMIN B 12: 245 pg/mL (ref 211–911)

## 2016-08-27 LAB — FOLATE: FOLATE: 11.7 ng/mL (ref >5.9)

## 2016-08-27 MED ORDER — SODIUM CHLORIDE 0.9 % IV SOLN
500.0000 mL | INTRAVENOUS | Status: DC
Start: 2016-08-27 — End: 2023-08-12

## 2016-08-27 MED ORDER — MESALAMINE ER 0.375 G PO CP24: 375.0000 mg | ORAL_CAPSULE | Freq: Every day | ORAL | 3 refills | Status: DC

## 2016-08-27 NOTE — Progress Notes (Signed)
Called to room to assist during endoscopic procedure.  Patient ID and intended procedure confirmed with present staff. Received instructions for my participation in the procedure from the performing physician.  

## 2016-08-27 NOTE — Patient Instructions (Addendum)
YOU HAD AN ENDOSCOPIC PROCEDURE TODAY AT [facility]:   Refer to the procedure report that was given to you for any specific questions about what was found during the examination.  If the procedure report does not answer your questions, please call your gastroenterologist to clarify.  If you requested that your care partner not be given the details of your procedure findings, then the procedure report has been included in a sealed envelope for you to review at your convenience later.  YOU SHOULD EXPECT: Some feelings of bloating in the abdomen. Passage of more gas than usual.  Walking can help get rid of the air that was put into your GI tract during the procedure and reduce the bloating. If you had a lower endoscopy (such as a colonoscopy or flexible sigmoidoscopy) you may notice spotting of blood in your stool or on the toilet paper. If you underwent a bowel prep for your procedure, you may not have a normal bowel movement for a few days.  Please Note:  You might notice some irritation and congestion in your nose or some drainage.  This is from the oxygen used during your procedure.  There is no need for concern and it should clear up in a day or so.  SYMPTOMS TO REPORT IMMEDIATELY:   Following lower endoscopy (colonoscopy or flexible sigmoidoscopy):  Excessive amounts of blood in the stool  Significant tenderness or worsening of abdominal pains  Swelling of the abdomen that is new, acute  Fever of 100F or higher    For urgent or emergent issues, a gastroenterologist can be reached at any hour by calling [phone].   DIET:  We do recommend a small meal at first, but then you may proceed to your regular diet.  Drink plenty of fluids but you should avoid alcoholic beverages for 24 hours.  ACTIVITY:  You should plan to take it easy for the rest of today and you should NOT DRIVE or use heavy machinery until tomorrow (because of the sedation medicines used during the test).     FOLLOW UP: Our staff will call the number listed on your records the next business day following your procedure to check on you and address any questions or concerns that you may have regarding the information given to you following your procedure. If we do not reach you, we will leave a message.  However, if you are feeling well and you are not experiencing any problems, there is no need to return our call.  We will assume that you have returned to your regular daily activities without incident.  If any biopsies were taken you will be contacted by phone or by letter within the next 1-3 weeks.  Please call us at [phone] if you have not heard about the biopsies in 3 weeks.    SIGNATURES/CONFIDENTIALITY: You and/or your care partner have signed paperwork which will be entered into your electronic medical record.  These signatures attest to the fact that that the information above on your After Visit Summary has been reviewed and is understood.  Full responsibility of the confidentiality of this discharge information lies with you and/or your care-partner.   INFORMATION ON POLYPS,DIVERTICULOSIS,ANDHEMORRHOIDS GIVEN TO YOU TODAY  MESALAMINE 4 TABLETS DAILY SENT TO [facility] [address]   DISCONTINUE LIALDA AND SULFASALAZINE  LABS TO BE DONE ON DISCHARGE TODAY

## 2016-08-27 NOTE — Progress Notes (Signed)
A and O x3. Report to RN. Tolerated MAC anesthesia well.

## 2016-08-27 NOTE — Op Note (Signed)
[place] Patient Name: [name] Procedure Date: 08/27/2016 1:17 PM MRN: [id number] Endoscopist: Mauri Pole , MD Age: 51 Referring MD:  Date of Birth: [birth year] Gender: Male Account #: [id number] Procedure:                Colonoscopy Indications:              Determine extent and severity of inflammatory bowel                            disease Medicines:                Monitored Anesthesia Care Procedure:                Pre-Anesthesia Assessment:                           - Prior to the procedure, a History and Physical                            was performed, and patient medications and                            allergies were reviewed. The patient's tolerance of                            previous anesthesia was also reviewed. The risks                            and benefits of the procedure and the sedation                            options and risks were discussed with the patient.                            All questions were answered, and informed consent                            was obtained. Prior Anticoagulants: The patient has                            taken no previous anticoagulant or antiplatelet                            agents. ASA Grade Assessment: II - A patient with                            mild systemic disease. After reviewing the risks                            and benefits, the patient was deemed in                            satisfactory condition to undergo the procedure.  After obtaining informed consent, the colonoscope                            was passed under direct vision. Throughout the                            procedure, the patient's blood pressure, pulse, and                            oxygen saturations were monitored continuously. The                            Model CF-HQ190L [id number]) scope was introduced                            through the anus and advanced to the the  cecum,                            identified by appendiceal orifice and ileocecal                            valve. The colonoscopy was performed without                            difficulty. The patient tolerated the procedure                            well. The quality of the bowel preparation was                            good. The ileocecal valve, appendiceal orifice, and                            rectum were photographed. Scope In: 1:25:35 PM Scope Out: 1:50:29 PM Scope Withdrawal Time: 0 hours 20 minutes 4 seconds  Total Procedure Duration: 0 hours 24 minutes 54 seconds  Findings:                 The perianal and digital rectal examinations were                            normal.                           Inflammation was found in a continuous and                            circumferential pattern from the rectum to the                            sigmoid colon. This was graded as Mayo Score 2                            (moderate, with marked erythema, absent vascular  pattern, friability, erosions). Biopsies were taken                            with a cold forceps for histology from cecum,                            ascending, transverse, descending, sigmoid colon                            and rectum.                           A 2 mm polyp was found in the sigmoid colon. The                            polyp was sessile. The polyp was removed with a                            cold biopsy forceps. Resection and retrieval were                            complete.                           Multiple small and large-mouthed diverticula were                            found in the sigmoid colon and descending colon.                            There was no evidence of diverticular bleeding.                           A 4 mm polyp was found in the ascending colon. The                            polyp was sessile. The polyp was removed with a                             cold snare. Resection and retrieval were complete.                           Non-bleeding internal hemorrhoids were found during                            retroflexion. The hemorrhoids were small. Complications:            No immediate complications. Estimated Blood Loss:     Estimated blood loss was minimal. Impression:               - Moderately active (Mayo Score 2) left-sided                            ulcerative colitis. Biopsied.                           -  One 2 mm polyp in the sigmoid colon, removed with                            a cold biopsy forceps. Resected and retrieved.                           - Moderate diverticulosis in the sigmoid colon and                            in the descending colon. There was no evidence of                            diverticular bleeding.                           - One 4 mm polyp in the ascending colon, removed                            with a cold snare. Resected and retrieved.                           - Non-bleeding internal hemorrhoids. Recommendation:           - Patient has a contact number available for                            emergencies. The signs and symptoms of potential                            delayed complications were discussed with the                            patient. Return to normal activities tomorrow.                            Written discharge instructions were provided to the                            patient.                           - Resume previous diet.                           - Continue present medications.                           - DC sulfasalazine and Start Mesalamine (Apriso) 4                            tablets daily                           - Return to GI clinic in 2-4 weeks.                           -  Follow up CBC, CMET, CRP, B12/Folate, Ferritin,                            Quantiferon TB                           - Await pathology results.                           - Repeat  colonoscopy date to be determined after                            pending pathology results are reviewed for                            surveillance based on pathology results. Mauri Pole, MD 08/27/2016 2:05:04 PM This report has been signed electronically.

## 2016-08-29 LAB — QUANTIFERON TB GOLD ASSAY (BLOOD)
INTERFERON GAMMA RELEASE ASSAY: NEGATIVE
Mitogen-Nil: 5.36 IU/mL
Quantiferon Nil Value: 0.04 IU/mL
Quantiferon Tb Ag Minus Nil Value: <0 IU/mL

## 2016-08-30 ENCOUNTER — Telehealth: Payer: Self-pay

## 2016-08-30 NOTE — Telephone Encounter (Signed)
Name identifier, left a voicemail we will call back later today.

## 2016-08-30 NOTE — Telephone Encounter (Signed)
  Follow up Call-  Call back number 08/27/2016  Post procedure Call Back phone  # [phone]  Permission to leave phone message Yes  Some recent data might be hidden    Patient was called for follow up after his procedure on 08/27/2016. No answer at the number given for follow up phone call. A message was left on the answering machine.

## 2016-09-06 ENCOUNTER — Encounter: Payer: Self-pay | Admitting: Gastroenterology

## 2017-02-01 ENCOUNTER — Other Ambulatory Visit: Payer: Self-pay | Admitting: Gastroenterology

## 2017-02-01 DIAGNOSIS — K5120 Ulcerative (chronic) proctitis without complications: Secondary | ICD-10-CM

## 2017-03-01 ENCOUNTER — Other Ambulatory Visit: Payer: Self-pay

## 2017-03-01 DIAGNOSIS — D649 Anemia, unspecified: Secondary | ICD-10-CM

## 2017-06-24 ENCOUNTER — Other Ambulatory Visit: Payer: Self-pay | Admitting: Gastroenterology

## 2017-06-24 DIAGNOSIS — K5120 Ulcerative (chronic) proctitis without complications: Secondary | ICD-10-CM

## 2017-07-05 ENCOUNTER — Other Ambulatory Visit: Payer: Self-pay | Admitting: Gastroenterology

## 2017-07-05 DIAGNOSIS — K5120 Ulcerative (chronic) proctitis without complications: Secondary | ICD-10-CM

## 2018-08-23 ENCOUNTER — Telehealth: Payer: Self-pay | Admitting: Gastroenterology

## 2018-08-23 DIAGNOSIS — K5120 Ulcerative (chronic) proctitis without complications: Secondary | ICD-10-CM

## 2018-08-23 MED ORDER — MESALAMINE ER 0.375 G PO CP24: ORAL_CAPSULE | ORAL | 0 refills | Status: DC

## 2018-08-23 NOTE — Telephone Encounter (Signed)
Patient was given enough 1 refill of Apriso until his appointment with Amy on 08/28/2018

## 2018-08-23 NOTE — Telephone Encounter (Signed)
Pt called in needing to get medication refill on an old pres from Dr.Nandigam pt do belive that is APRISO 0.375 g 24 hr capsule [[id number]]  He is schd for an offc visit 08/28/2018@9am  with Amy esterwood

## 2018-08-28 ENCOUNTER — Ambulatory Visit: Payer: No Typology Code available for payment source | Admitting: Physician Assistant

## 2018-08-28 ENCOUNTER — Encounter: Payer: Self-pay | Admitting: Physician Assistant

## 2018-08-28 VITALS — BP 130/88 | HR 72 | Ht 70.0 in | Wt 239.0 lb

## 2018-08-28 DIAGNOSIS — Z860101 Personal history of adenomatous and serrated colon polyps: Secondary | ICD-10-CM

## 2018-08-28 DIAGNOSIS — K625 Hemorrhage of anus and rectum: Secondary | ICD-10-CM

## 2018-08-28 DIAGNOSIS — Z86010 Personal history of colonic polyps: Secondary | ICD-10-CM

## 2018-08-28 DIAGNOSIS — K5120 Ulcerative (chronic) proctitis without complications: Secondary | ICD-10-CM | POA: Diagnosis not present

## 2018-08-28 MED ORDER — BUDESONIDE ER 9 MG PO TB24: 1.0000 | ORAL_TABLET | Freq: Every morning | ORAL | 1 refills | Status: DC

## 2018-08-28 MED ORDER — MESALAMINE ER 0.375 G PO CP24: ORAL_CAPSULE | ORAL | 11 refills | Status: DC

## 2018-08-28 MED ORDER — HYOSCYAMINE SULFATE SL 0.125 MG SL SUBL: SUBLINGUAL_TABLET | SUBLINGUAL | 2 refills | Status: DC

## 2018-08-28 NOTE — Progress Notes (Signed)
Subjective:    Patient ID: [name], male    DOB: 1966/05/06, 53 y.o.   MRN: [id number]  HPI [name] is a pleasant 53 year old white male, known to Dr. Silverio Decamp with history of left-sided ulcerative colitis.  He was last seen in our office when he had colonoscopy in January 2018.  He was found to have inflammation from the rectum to the sigmoid, 2 small polyps were removed, also with multiple diverticuli and nonbleeding internal hemorrhoids.  Path on the polyps, the a sending colon polyp was a tubular adenoma of the descending polyp showed normal colonic mucosa.  Biopsies were all normal without active inflammation with the exception of the rectum which showed chronic mildly active colitis. Patient had been managed in the past with Apriso 0.375 mg, 4 daily.  He says that he took the medication for 2018, then ran out and as he was not having any active symptoms and the medication was expensive he did not take it over the past year. Comes in today stating that he has had a flareup of symptoms over the past 3 to 4 weeks.  Initially he started seeing a small amount of bright red blood mixed with bowel movements and on the tissue.  He says he always has 3-4 bowel movements per day, and had been having formed stools.  Over the past few weeks his symptoms have worsened in very frequent bowel movements especially early in the morning For example today he was awakened at 4:30 AM and has had 5 urgent bowel movements thus far.  He says most of those are just small volume stools which primarily consist of bloody mucus will have a normal bowel movement.  He has had some mild lower abdominal cramping, no ongoing pain, no fever or chills, no nausea and appetite has been okay. He has been under a lot of personal stress over the past few months and wonders if that may have flared up his colitis. He has not started any new medications, NSAIDs and no recent antibiotics. He called the office late last week and has  restarted Apriso  tablets daily.  Review of Systems Pertinent positive and negative review of systems were noted in the above HPI section.  All other review of systems was otherwise negative.  Outpatient Encounter Medications as of 08/28/2018  Medication Sig  . allopurinol (ZYLOPRIM) 300 MG tablet Take 300 mg by mouth daily.    Marland Kitchen amLODipine (NORVASC) 5 MG tablet Take 10 mg by mouth daily.   Marland Kitchen atenolol (TENORMIN) 50 MG tablet Take 50 mg by mouth 2 (two) times daily.   . furosemide (LASIX) 20 MG tablet Take 20 mg by mouth daily.  . mesalamine (APRISO) 0.375 g 24 hr capsule TAKE 4 CAPSULES BY MOUTH ONCE DAILY  . omeprazole (PRILOSEC) 20 MG capsule Take 20 mg by mouth daily.    . [DISCONTINUED] mesalamine (APRISO) 0.375 g 24 hr capsule TAKE 4 CAPSULES BY MOUTH ONCE DAILY  . Budesonide ER (UCERIS) 9 MG TB24 Take 1 tablet by mouth every morning.  Marland Kitchen Hyoscyamine Sulfate SL (LEVSIN/SL) 0.125 MG SUBL Dissolve 1 tablet by mouth 30 minutes before a meal as needed for abdominal cramping. Urgency, diarrhea.  . [DISCONTINUED] APRISO 0.375 g 24 hr capsule TAKE 4 CAPSULES BY MOUTH ONCE DAILY  . [DISCONTINUED] losartan (COZAAR) 100 MG tablet Take 100 mg by mouth daily.    . [DISCONTINUED] UCERIS 9 MG TB24 TAKE 1 TABLET BY MOUTH EVERY DAY (Patient not taking: Reported on  08/27/2016)   Facility-Administered Encounter Medications as of 08/28/2018  Medication  . 0.9 %  sodium chloride infusion   Allergies  Allergen Reactions  . Codeine Rash    Cough medicine with codeine   Patient Active Problem List   Diagnosis Date Noted  . Osteopenia 09/03/2013  . Closed right tibial fracture 07/14/2013  . Alcohol intoxication ([place]) 07/14/2013  . Left sided ulcerative (chronic) colitis ([place]) 04/27/2011  . Esophageal reflux 04/27/2011  . ESSENTIAL HYPERTENSION, BENIGN 11/03/2009  . UNSPECIFIED ULCERATIVE COLITIS 11/03/2009   Social History   Socioeconomic History  . Marital status: Married    Spouse name: Not on  file  . Number of children: 3  . Years of education: Not on file  . Highest education level: Not on file  Occupational History  . Occupation: SUPERVISOR    Employer: [employer]  . Financial resource strain: Not on file  . Food insecurity:    Worry: Not on file    Inability: Not on file  . Transportation needs:    Medical: Not on file    Non-medical: Not on file  Tobacco Use  . Smoking status: Former Smoker    Last attempt to quit: 08/16/2005    Years since quitting: 13.0  . Smokeless tobacco: Never Used  Substance and Sexual Activity  . Alcohol use: Yes    Alcohol/week: 36.0 standard drinks    Types: 36 Cans of beer per week  . Drug use: No  . Sexual activity: Not on file  Lifestyle  . Physical activity:    Days per week: Not on file    Minutes per session: Not on file  . Stress: Not on file  Relationships  . Social connections:    Talks on phone: Not on file    Gets together: Not on file    Attends religious service: Not on file    Active member of club or organization: Not on file    Attends meetings of clubs or organizations: Not on file    Relationship status: Not on file  . Intimate partner violence:    Fear of current or ex partner: Not on file    Emotionally abused: Not on file    Physically abused: Not on file    Forced sexual activity: Not on file  Other Topics Concern  . Not on file  Social History Narrative  . Not on file    [name]'s family history is not on file. He was adopted.      Objective:    Vitals:   08/28/18 0846  BP: 130/88  Pulse: 72    Physical Exam; well-developed white male in no acute distress, pleasant, height 5 foot 10, weight 239, BMI 34.2.  HEENT; nontraumatic normocephalic EOMI PERRLA sclera anicteric oral mucosa moist, Cardiovascular ;regular rate and rhythm with S1-S2 no murmur rub or gallop, Pulmonary ;clear bilaterally, Abdomen;soft, bowel sounds are present, no palpable mass or hepatosplenomegaly  there is  mild tenderness in the left lower quadrant no guarding or rebound.  Rectal ;exam not done.  Extremities; no clubbing cyanosis or edema skin warm and dry, Neuropsych; alert and oriented, grossly nonfocal mood and affect appropriate       Assessment & Plan:   #[age]-year-old white male with history of ulcerative proctosigmoiditis who comes in today with an exacerbation.  He has been off of medication over the past year, and has had recurrence of symptoms over the past 3 to 4 weeks  which has become progressive. At present he is having multiple small full volume bloody mucoid urgent stools per day, minimal lower abdominal cramping.  #2 history of adenomatous colon polyps-up-to-date with colonoscopy last done January 2018, follow-up will be due January 2023. #3Diverticulosis #4.  Internal hemorrhoids #5.  Hypertension #6.  GERD-stable on omeprazole 20 mg daily #7.  Regular EtOH use.  Plan; continue Apriso 0.375 mg 4 tablets p.o. every morning, refill sent x1 year Start Uceris 9 mg p.o. daily x2 months. Levsin 0.125 p.o. every 4 to 6 hours PRN cramping/urgency. Patient states that he is planning a hunting trip in February to [place] and will be in the wilderness.  I asked him to call back in 2 weeks with a progress report, if he is not made any significant improvement with Uceris we may need to give him a course of prednisone.   Genia Harold PA-C 08/28/2018   Cc: Nicoletta Dress, MD

## 2018-08-28 NOTE — Patient Instructions (Signed)
We sent refills to [facility], [place] 2. Uceris 9 mg 3. Levsin ( Hyoscyamine) 0.125 mg.    Call us back in 2 weeks to speak to a nurse if not better.  Normal BMI (Body Mass Index- based on height and weight) is between 19 and 25. Your BMI today is Body mass index is 34.29 kg/m. Marland Kitchen Please consider follow up  regarding your BMI with your Primary Care Provider.

## 2018-08-31 NOTE — Progress Notes (Signed)
Reviewed and agree with documentation and assessment and plan. K. Veena Brayten Komar , MD   

## 2018-09-01 ENCOUNTER — Other Ambulatory Visit: Payer: Self-pay | Admitting: *Deleted

## 2018-09-01 ENCOUNTER — Telehealth: Payer: Self-pay | Admitting: Physician Assistant

## 2018-09-01 MED ORDER — BUDESONIDE ER 9 MG PO TB24: 1.0000 | ORAL_TABLET | Freq: Every morning | ORAL | 3 refills | Status: DC

## 2018-09-01 NOTE — Telephone Encounter (Signed)
Called script in and sent electronically Budesonide ( Uceris) 9 mg tablets. # 30 with 3 refills.  The patient is to take 1 tablet daily. LM for the patient to advise I called this in today to [facility], [address], [place], [place].

## 2018-09-27 ENCOUNTER — Telehealth: Payer: Self-pay | Admitting: Physician Assistant

## 2018-09-27 NOTE — Telephone Encounter (Signed)
Pt stated that his symptoms has not cleared up and was advised to call back in if they do not.

## 2018-09-27 NOTE — Telephone Encounter (Signed)
Pt continues to have multiple loose stools daily ( at least 10 per day). They are not watery per pt.  During the night stool becomes bloody. Bright red in color and can have mucous.  The diarrhea occurs urgently after eating.  Admits to very little LLQ abdominal pain.  Pt is currently taking Apriso 4 tablets qd, Uceris qd and Hyocyamine.  Pt stated that he just recently resumed Hyocyamine for a few days after being off of it for one week.    He feels that none of the medications are helping. Pt is also under a lot of stress due to divorce.  Pt wants to know what he should do??

## 2018-09-28 ENCOUNTER — Other Ambulatory Visit: Payer: Self-pay

## 2018-09-28 DIAGNOSIS — K5120 Ulcerative (chronic) proctitis without complications: Secondary | ICD-10-CM

## 2018-09-28 MED ORDER — PREDNISONE 10 MG PO TABS: ORAL_TABLET | ORAL | 0 refills | Status: DC

## 2018-09-28 MED ORDER — MESALAMINE ER 0.375 G PO CP24: ORAL_CAPSULE | ORAL | 3 refills | Status: DC

## 2018-09-28 NOTE — Telephone Encounter (Signed)
Ok - have him continue Apriso 4 tablets daily, continue Hyocyamine  4 x daily as needed.  May use imodium OTC as needed for dairrhea  Stop Uceris , and start Prednisone 40 mg po daily in am x 2 weeks ,then 30 mg daily x 2 weeks, then 20 mg daily x 2 weeks ,then 10 mg daily x 2 weeks .  He needs a follow up visit scheduled  With  Dr Silverio Decamp in  4 weeks

## 2018-09-28 NOTE — Telephone Encounter (Signed)
Patient is instructed. Apriso refilled. Follow up appointment 10/29/18 at 9:15 am. Appointment card mailed.

## 2018-09-29 ENCOUNTER — Telehealth: Payer: Self-pay

## 2018-09-29 NOTE — Telephone Encounter (Signed)
Received fax from [facility] that Mesalamine 0.375 caps requires a prior auth  Requested PA through CovermyMeds for Mesalamin 0.375 QID #120 90 days supply today.  PA # [id number]

## 2018-10-05 NOTE — Telephone Encounter (Signed)
Pt returned call.  He has refills of Apriso already. He states that he has the brand name of if and Uceris. He does not need samples.

## 2018-10-05 NOTE — Telephone Encounter (Signed)
In CovermyMeds Mesalamine has been denied.  Left message for pt to call back.  We have some samples we can give him if he needs.

## 2018-10-25 ENCOUNTER — Ambulatory Visit: Payer: Self-pay | Admitting: Gastroenterology

## 2019-02-21 ENCOUNTER — Other Ambulatory Visit: Payer: Self-pay | Admitting: Physician Assistant

## 2019-02-21 DIAGNOSIS — K5120 Ulcerative (chronic) proctitis without complications: Secondary | ICD-10-CM

## 2019-08-18 ENCOUNTER — Other Ambulatory Visit: Payer: Self-pay | Admitting: Gastroenterology

## 2019-08-18 DIAGNOSIS — K5120 Ulcerative (chronic) proctitis without complications: Secondary | ICD-10-CM

## 2019-09-27 ENCOUNTER — Other Ambulatory Visit: Payer: Self-pay | Admitting: Gastroenterology

## 2019-09-27 DIAGNOSIS — K5120 Ulcerative (chronic) proctitis without complications: Secondary | ICD-10-CM

## 2019-10-01 ENCOUNTER — Telehealth: Payer: Self-pay | Admitting: Physician Assistant

## 2019-10-01 DIAGNOSIS — K5120 Ulcerative (chronic) proctitis without complications: Secondary | ICD-10-CM

## 2019-10-01 MED ORDER — APRISO 0.375 G PO CP24: ORAL_CAPSULE | ORAL | 1 refills | Status: DC

## 2019-10-01 NOTE — Telephone Encounter (Addendum)
Apriso sent to pharmacy

## 2019-10-01 NOTE — Addendum Note (Signed)
Addended by: Oda Kilts on: 10/01/2019 04:28 PM   Modules accepted: Orders

## 2019-11-15 DEATH — deceased

## 2021-11-24 ENCOUNTER — Encounter: Payer: Self-pay | Admitting: Gastroenterology

## 2023-04-11 ENCOUNTER — Ambulatory Visit: Payer: Self-pay | Admitting: Licensed Clinical Social Worker
